# Patient Record
Sex: Male | Born: 1970 | Race: White | Hispanic: No | Marital: Married | Smoking: Current every day smoker
Health system: Southern US, Community
[De-identification: ages and names within clinical notes are randomized; demographics above are authoritative.]

## PROBLEM LIST (undated history)

## (undated) ENCOUNTER — Telehealth

## (undated) ENCOUNTER — Encounter

## (undated) ENCOUNTER — Ambulatory Visit

## (undated) ENCOUNTER — Telehealth: Attending: Infectious Disease | Primary: Infectious Disease

## (undated) ENCOUNTER — Ambulatory Visit: Payer: MEDICARE

## (undated) ENCOUNTER — Inpatient Hospital Stay: Payer: MEDICARE

## (undated) ENCOUNTER — Ambulatory Visit
Payer: MEDICARE | Attending: Rehabilitative and Restorative Service Providers" | Primary: Rehabilitative and Restorative Service Providers"

## (undated) ENCOUNTER — Encounter: Attending: Internal Medicine | Primary: Internal Medicine

## (undated) ENCOUNTER — Ambulatory Visit: Payer: MEDICARE | Attending: Geriatric Medicine | Primary: Geriatric Medicine

## (undated) ENCOUNTER — Encounter: Attending: Dermatology | Primary: Dermatology

## (undated) DIAGNOSIS — I10 Essential (primary) hypertension: Secondary | ICD-10-CM

## (undated) DIAGNOSIS — E119 Type 2 diabetes mellitus without complications: Secondary | ICD-10-CM

## (undated) HISTORY — PX: SHOULDER SURGERY: SHX246

## (undated) HISTORY — PX: NECK SURGERY: SHX720

## (undated) HISTORY — PX: KNEE SURGERY: SHX244

## (undated) MED ORDER — ACETAMINOPHEN 500 MG TABLET: Freq: Two times a day (BID) | ORAL | 0 days

---
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
## 2009-07-30 ENCOUNTER — Emergency Department: Payer: Self-pay | Admitting: Emergency Medicine

## 2010-05-13 ENCOUNTER — Ambulatory Visit: Payer: Self-pay | Admitting: Family Medicine

## 2016-10-18 ENCOUNTER — Ambulatory Visit
Admission: RE | Admit: 2016-10-18 | Discharge: 2016-10-18 | Payer: MEDICARE | Attending: Internal Medicine | Admitting: Internal Medicine

## 2016-10-18 DIAGNOSIS — I10 Essential (primary) hypertension: Secondary | ICD-10-CM

## 2016-10-18 DIAGNOSIS — G4700 Insomnia, unspecified: Secondary | ICD-10-CM

## 2016-10-18 DIAGNOSIS — L409 Psoriasis, unspecified: Secondary | ICD-10-CM

## 2016-10-18 DIAGNOSIS — L4050 Arthropathic psoriasis, unspecified: Secondary | ICD-10-CM

## 2016-10-18 DIAGNOSIS — E119 Type 2 diabetes mellitus without complications: Principal | ICD-10-CM

## 2016-10-18 DIAGNOSIS — Z720 Tobacco use: Secondary | ICD-10-CM

## 2016-10-18 DIAGNOSIS — E7800 Pure hypercholesterolemia, unspecified: Secondary | ICD-10-CM

## 2016-10-18 MED ORDER — METFORMIN 500 MG TABLET
ORAL_TABLET | Freq: Every day | ORAL | 0 refills | 0.00000 days
Start: 2016-10-18 — End: 2016-11-20

## 2016-10-18 MED ORDER — LISINOPRIL 20 MG TABLET
ORAL_TABLET | Freq: Every day | ORAL | 1 refills | 0 days | Status: CP
Start: 2016-10-18 — End: 2016-11-20

## 2016-10-18 MED ORDER — ZOLPIDEM ER 12.5 MG TABLET,EXTENDED RELEASE,MULTIPHASE
ORAL_TABLET | Freq: Every evening | ORAL | 2 refills | 0 days | Status: CP | PRN
Start: 2016-10-18 — End: 2017-10-18

## 2016-10-18 MED ORDER — METHOCARBAMOL 750 MG TABLET
ORAL_TABLET | Freq: Two times a day (BID) | ORAL | 0 refills | 0.00000 days | Status: CP
Start: 2016-10-18 — End: 2017-09-30

## 2016-10-18 NOTE — Unmapped (Signed)
INTERNAL MEDICINE OUTPATIENT NOTE -- REESTABLISH CARE        ASSESSMENT  1. Type 2 diabetes mellitus without complication, without long-term current use of insulin (CMS-HCC)  CBC w/ Differential    Albumin/creatinine urine ratio    Hemoglobin A1c   2. Hypertension, benign  Comprehensive Metabolic Panel   3. Hypercholesterolemia  HDL Cholesterol    LDL Cholesterol, Direct    Cholesterol, Total   4. Psoriatic arthritis (CMS-HCC)  Ambulatory referral to Rheumatology   5. Psoriasis  Ambulatory referral to Dermatology   6. Tobacco use     7. Insomnia, unspecified type           PLAN  1. Diabetes is at goal, per his reported A1c measurements. Continue current medications. Update UACR, foot exam, and CBC today. We discussed the need to schedule a diabetic retinal eye exam in the near future.  2. Hypertension is not at goal. Will add lisinopril, which will also offer renal protection with his diabetes. Also updating CMP today.   3. Update lipid levels today. Will likely restart atorvastatin at next appointment.   4 / 5. Referring to rheumatology and dermatology to reestablish care. Humira no longer on insurance formulary.  5. Referring to dermatology to reestablish care.   6. We decided to defer tobacco cessation efforts until his current stressors are under better control. Congratulated him on his recent reduction in tobacco use.   7. Since wife's death, sleep has been difficult. He is currently managing with a combination of over-the-counter sleep aids, diphenhydramine, and alcohol. Considering the risks with continuing this regimen, we decided that is just able to reinitiate therapy with zolpidem 12.5 mg nightly as needed. Discussed this decision in detail with the patient, who is in agreement with the plan.    Preventive services addressed today  Influenza: patient declined    -- Patient verbalized an understanding of today's assessment and recommendations, as well as the purpose of ongoing medications.    Return in about 1 month (around 11/18/2016).    Reason for Visit:   Reestablish care    History of Present Illness:    This is a 46 y.o. year old male who presents to reestablish care. Last seen June 2015. He moved to [place], and established care there.     He moved back because his wife committed suicide on July 31. In addition, he lost two other family members in the last 18 months, including his father. He feels he has been handling this traumatic loss well. No SI/HI. He has strong family support nearby, and he is attempting to reestablish with psychiatry at the [place]. He continues on prazosin, although he is unsure of what dosage.    1. Diabetes: Metformin 1,000 mg once daily. He hasn't been checking FSGs recently. He recalls A1c measurements of 6.0-6.2% at his primary care office in [place]. He is due for foot exam, and he has not had an eye exam in the last year. Previously on liraglutide, however the medication is no longer on his insurance formulary.    2. Hypertension: In [place], he was able to discontinue all blood pressure medications and maintain good control. However, he recently measured a blood pressure of 176/116. Today's BP: 142/98. No headache, chest pain, or shortness of breath.     3. Hypercholesterolemia: The 10-year ASCVD risk score Denman George DC Jr., et al., 2013) is: 9.2%. Previously taking Crestor.     4. Psoriasis / Psoriatic arthritis: Ongoing issues with skin manifestations of  psoriasis and joint pains related to psoriatic arthritis. He was previously treated with Humira, however the medication was removed from his insurance formulary. He has been off this for at least a year. He is seeking to reestablish care with rheumatology and dermatology at [facility].    5. Tobacco use: He is currently smoking approximately one pack per day, which is actually down from 2.5-3 packs per day around his wife's suicide.     6. Insomnia: He has noticed increased difficulty sleeping since his wife's suicide. He has been managing with over-the-counter sleep aids and occasionally he will take diphenhydramine as well. He has also found a small amount of alcohol helps him fall asleep. He was previously taking zolpidem.     He has been using a CPAP machine nightly. No daytime somnolence.    Since moving to [place], he has had spinal fusion surgery in his neck and arthroscopic knee surgery.     REVIEW OF SYSTEMS: A comprehensive review of systems was conducted and is negative except for the above.    Outpatient Medications Prior to Visit   Medication Sig Dispense Refill   ??? aspirin (ECOTRIN) 81 MG tablet Take 81 mg by mouth daily.     ??? clobetasol (TEMOVATE) 0.05 % external solution Apply 1 application topically two (2) times a day as needed.     ??? esomeprazole (NEXIUM) 40 MG capsule Take 1 capsule (40 mg total) by mouth every morning before breakfast. 90 capsule 0   ??? fluticasone (FLONASE) 50 mcg/actuation nasal spray 2 sprays by Each Nare route daily. 16 g 5   ??? hydrOXYzine (ATARAX) 50 MG tablet Take 50 mg by mouth Two (2) times a day.  0   ??? ketoconazole (NIZORAL) 2 % shampoo Apply to the scalp and beard area three times a week (leave on for 15 minutes) 120 mL 5   ??? prazosin (MINIPRESS) 1 MG capsule Take 1 mg by mouth daily.     ??? zonisamide (ZONEGRAN) 100 MG capsule Take 2-3 capsules by mouth nightly.     ??? metFORMIN (GLUCOPHAGE) 500 MG tablet Take 1,000 mg by mouth Two (2) times a day.     ??? blood sugar diagnostic (GLUCOSE BLOOD) Strp 1 each by Other route daily.     ??? celecoxib (CELEBREX) 200 MG capsule Take 200 mg by mouth Two (2) times a day.     ??? folic acid (FOLVITE) 1 MG tablet Take 1 mg by mouth daily.     ??? vitamin E 400 UNIT capsule Take 400 Units by mouth daily.     ??? adalimumab (HUMIRA PEN) 40 mg/0.8 mL injection Inject 1 pen (40 mg) subcutaneously every 2 weeks (Patient not taking: Reported on 10/18/2016) 2 kit 9   ??? adalimumab (HUMIRA) 40 mg/0.8 mL injection Start with 80 mg Parke x 1 on week 0, then 40 mg Sharpes every 2 weeks starting on week 1. (Patient not taking: Reported on 10/18/2016) 2 each 2   ??? baclofen (LIORESAL) 10 MG tablet Take 1 tablet (10 mg total) by mouth nightly as needed. (Patient not taking: Reported on 10/18/2016) 90 tablet 3   ??? CIALIS 20 mg tablet TAKE 1 TABLET AS NEEDED (Patient not taking: Reported on 10/18/2016) 6 tablet 5   ??? CIPRODEX otic suspension ADMINISTER 4 DROPS INTO   THE LEFT EAR TWO TIMES A  DAY FOR 10 DAYS (Patient not taking: Reported on 10/18/2016) 7.5 mL ml   ??? fluocinonide (LIDEX) 0.05 % external solution Apply  topically up to twice daily. (Patient not taking: Reported on 10/18/2016) 60 mL 0   ??? HUMIRA PEN 40 mg/0.8 mL injection INJECT 1 PEN (40MG )       SUBCUTANEOUSLY EVERY 2    WEEKS (Patient not taking: Reported on 10/18/2016) 3 kit 3   ??? insulin needles, disposable, (BD ULTRA-FINE NANO PEN NEEDLES) 32 x 5/32  Ndle Use with victoza pen once daily. 250.02 (Patient not taking: Reported on 10/18/2016) 100 each 3   ??? liraglutide (VICTOZA) 0.6 mg/0.1 mL (18 mg/3 mL) injection 1.2 mg once daily. (Patient not taking: Reported on 10/18/2016) 6 mL 1   ??? lisinopril (PRINIVIL,ZESTRIL) 10 MG tablet Take 1 tablet (10 mg total) by mouth daily. 90 tablet 3   ??? rosuvastatin (CRESTOR) 40 MG tablet Take 0.5 tablets (20 mg total) by mouth daily with evening meal. (Patient not taking: Reported on 10/18/2016) 30 tablet 6     No facility-administered medications prior to visit.        Allergies   Allergen Reactions   ??? Primaquine      Other reaction(s): OTHER       Updated History:  As part of today's comprehensive wellness visit, I have reviewed and updated the following portions of the patient's history in the electronic record: allergies, current medications, past medical history, past surgical history, past family history, past social history and active problem list.      PHYSICAL EXAM:  BP 142/98 (BP Site: L Arm, BP Position: Sitting, BP Cuff Size: Medium)  - Pulse 100  - Temp 37.1 ??C (98.7 ??F) (Oral)  - Ht 177.8 cm (5' 10)  - Wt (!) 107 kg (236 lb)  - SpO2 97%  - BMI 33.86 kg/m??   GENERAL:  This is a pleasant male in no distress.  The patient maintains good eye contact, good judgment, fluent speech, normal affect.  EYES:  Pupils are equal, round and reactive to light.  Anicteric sclerae.  ENT:  Tympanic membranes are clear bilaterally.  Oropharynx is moist, no lesions, good dentition.   NECK:  Supple, no lymphadenopathy.  THYROID: No enlargement or nodules.   LUNGS:  Relaxed respiratory effort.  Clear to auscultation bilaterally.   CARDIOVASCULAR:  Regular rate and rhythm, no murmurs.   ABDOMEN:  Normal bowel sounds, soft, nontender, nondistended, no masses or organomegaly.   MSK: No focal muscle tenderness.  SKIN: Large, dry, scaly plaques on elbows bilaterally. Small scattered plaques on lower legs bilaterally.   NEURO: Stable gait and coordination   EXTREMITIES:  No edema.  Peripheral pulses are 2+ in the lower extremities bilaterally.       Note - This record has been created using [employer]. Chart creation errors have been sought, but may not always have been located. Such creation errors do not reflect on the standard of medical care.    The documentation recorded by the scribe accurately reflects the service I personally performed and the decisions made by me.    Dr. Dione Housekeeper, MD  October 18, 2016 10:56 AM     I attest that I, Veto Kemps, personally documented this note while acting as scribe for Dr. Dione Housekeeper, MD.      Veto Kemps, Medical Scribe.  October 18, 2016 10:56 AM

## 2016-10-22 ENCOUNTER — Ambulatory Visit: Admission: RE | Admit: 2016-10-22 | Discharge: 2016-10-22 | Payer: MEDICARE

## 2016-10-22 DIAGNOSIS — L409 Psoriasis, unspecified: Principal | ICD-10-CM

## 2016-10-22 MED ORDER — CLOBETASOL 0.05 % TOPICAL OINTMENT
Freq: Two times a day (BID) | TOPICAL | 3 refills | 0 days | Status: CP
Start: 2016-10-22 — End: 2017-10-22

## 2016-10-22 MED ORDER — ADALIMUMAB 40 MG/0.8 ML SUBCUTANEOUS PEN KIT
SUBCUTANEOUS | 0 refills | 0.00000 days | Status: CP
Start: 2016-10-22 — End: 2016-10-22

## 2016-10-22 MED ORDER — ADALIMUMAB 40 MG/0.8 ML SUBCUTANEOUS PEN KIT: each | 0 refills | 0 days

## 2016-10-22 MED ORDER — CLOBETASOL 0.05 % SCALP SOLUTION
Freq: Two times a day (BID) | TOPICAL | 3 refills | 0.00000 days | Status: CP | PRN
Start: 2016-10-22 — End: 2018-09-17

## 2016-10-24 NOTE — Unmapped (Signed)
Humira $24.00

## 2016-10-25 NOTE — Unmapped (Signed)
Spoke to patient to onboard and counsel patient on Humira. Patient states he cannot afford copay ($24.00/month) and declined medication due to cost. Will send referral to medication assistance team at [facility] pharmacy to see if there is any additional/available assistance for the patient.     Annie Main, PharmD Candidate  Pager: [phone]

## 2016-10-28 NOTE — Unmapped (Signed)
Patient was supposed to get labs at his recent appointment, but they were not done.  Please contact him to schedule a lab only appointment.  Thanks.

## 2016-10-29 NOTE — Unmapped (Signed)
Called pt and left vamil to schedule lab appt- call #1

## 2016-10-30 MED ORDER — HYDROCHLOROTHIAZIDE 25 MG TABLET
ORAL_TABLET | Freq: Every day | ORAL | 3 refills | 0.00000 days | Status: CP
Start: 2016-10-30 — End: 2016-11-20

## 2016-10-30 NOTE — Unmapped (Signed)
I sent a prescription to his pharmacy for HCTZ 25 mg once daily.  He can take this w/ the lisinopril.  He was supposed to get labs done after his recent appointment, but left without having them drawn.  Please see if he can come and do a lab only visit sometime this week or next.  Thanks.

## 2016-10-30 NOTE — Unmapped (Signed)
Patient states that since adding lisinopril at his last appointment ( seen on 10/18/16), his blood pressure readings are averaging 170/117, and that he is still having headaches. Please advise. Thanks.

## 2016-10-30 NOTE — Unmapped (Signed)
Patient notified of medication and will pick up today.  I have scheduled him for labs tomorrow at 9 am.

## 2016-10-31 ENCOUNTER — Ambulatory Visit
Admission: RE | Admit: 2016-10-31 | Discharge: 2016-10-31 | Disposition: A | Discharge status: Home / Self Care | Payer: MEDICARE

## 2016-10-31 DIAGNOSIS — I10 Essential (primary) hypertension: Principal | ICD-10-CM

## 2016-10-31 DIAGNOSIS — E119 Type 2 diabetes mellitus without complications: Secondary | ICD-10-CM

## 2016-10-31 DIAGNOSIS — E7800 Pure hypercholesterolemia, unspecified: Secondary | ICD-10-CM

## 2016-10-31 LAB — CBC W/ AUTO DIFF
EOSINOPHILS ABSOLUTE COUNT: 0.1 10*9/L (ref 0.0–0.4)
HEMATOCRIT: 50.7 % (ref 41.0–53.0)
LARGE UNSTAINED CELLS: 2 % (ref 0–4)
LYMPHOCYTES ABSOLUTE COUNT: 2.5 10*9/L (ref 1.5–5.0)
MEAN CORPUSCULAR HEMOGLOBIN CONC: 33.7 g/dL (ref 31.0–37.0)
MEAN CORPUSCULAR VOLUME: 90.1 fL (ref 80.0–100.0)
MEAN PLATELET VOLUME: 8.2 fL (ref 7.0–10.0)
MONOCYTES ABSOLUTE COUNT: 0.4 10*9/L (ref 0.2–0.8)
NEUTROPHILS ABSOLUTE COUNT: 3.7 10*9/L (ref 2.0–7.5)
PLATELET COUNT: 283 10*9/L (ref 150–440)
RED BLOOD CELL COUNT: 5.63 10*12/L (ref 4.50–5.90)
RED CELL DISTRIBUTION WIDTH: 14 % (ref 12.0–15.0)
WBC ADJUSTED: 6.9 10*9/L (ref 4.5–11.0)

## 2016-10-31 LAB — COMPREHENSIVE METABOLIC PANEL
ALBUMIN: 4.3 g/dL (ref 3.5–5.0)
ALKALINE PHOSPHATASE: 84 U/L (ref 38–126)
ALT (SGPT): 68 U/L (ref 19–72)
ANION GAP: 11 mmol/L (ref 9–15)
AST (SGOT): 33 U/L (ref 19–55)
BILIRUBIN TOTAL: 0.6 mg/dL (ref 0.0–1.2)
BLOOD UREA NITROGEN: 13 mg/dL (ref 7–21)
BUN / CREAT RATIO: 18
CALCIUM: 9.8 mg/dL (ref 8.5–10.2)
CHLORIDE: 99 mmol/L (ref 98–107)
CO2: 28 mmol/L (ref 22.0–30.0)
CREATININE: 0.74 mg/dL (ref 0.70–1.30)
EGFR MDRD AF AMER: >=60 mL/min/{1.73_m2} (ref >=60)
EGFR MDRD NON AF AMER: >=60 mL/min/{1.73_m2} (ref >=60)
GLUCOSE RANDOM: 214 mg/dL — ABNORMAL HIGH (ref 65–179)
POTASSIUM: 3.8 mmol/L (ref 3.5–5.0)
SODIUM: 138 mmol/L (ref 135–145)

## 2016-10-31 LAB — ALBUMIN / CREATININE URINE RATIO
ALBUMIN/CREATININE RATIO: 49 ug/mg — ABNORMAL HIGH (ref 0.0–30.0)
CREATININE, URINE: 89.8 mg/dL

## 2016-10-31 LAB — ALBUMIN QUANT URINE: Albumin:MCnc:Pt:Urine:Qn:: 4.4

## 2016-10-31 LAB — CO2: Carbon dioxide:SCnc:Pt:Ser/Plas:Qn:: 28

## 2016-10-31 LAB — ESTIMATED AVERAGE GLUCOSE: Estimated average glucose:MCnc:Pt:Bld:Qn:Estimated from glycated hemoglobin: 194

## 2016-10-31 LAB — HDL CHOLESTEROL: Cholesterol.in HDL:MCnc:Pt:Ser/Plas:Qn:: 41

## 2016-10-31 LAB — LDL CHOLESTEROL DIRECT: Cholesterol.in LDL:MCnc:Pt:Ser/Plas:Qn:Direct assay: 159.8 — ABNORMAL HIGH

## 2016-10-31 LAB — CHOLESTEROL: Cholesterol:MCnc:Pt:Ser/Plas:Qn:: 235 — ABNORMAL HIGH

## 2016-10-31 LAB — HEMOGLOBIN A1C: ESTIMATED AVERAGE GLUCOSE: 194 mg/dL

## 2016-10-31 LAB — LYMPHOCYTES ABSOLUTE COUNT: Lab: 2.5

## 2016-11-01 NOTE — Unmapped (Signed)
Humira $24.00; Co-Pay Card program, Patient ineligible due to Tricare coverage, patient will have to use payment plan

## 2016-11-02 NOTE — Unmapped (Signed)
-----   Message from Loistine Chance, MD sent at 11/01/2016  2:41 PM EDT -----  Please contact patient to see if his blood pressure is better since starting HCTZ.  Please also let him know the following about his blood work:  -- Your cholesterol is higher than desired.  I would like to get you started back on cholesterol lowering medication. -->  If he is ok with it, I would like to send a prescription to his pharmacy for atorvastatin 40 mg qd  -- A1C (measure of blood sugar over the past 3 months) is high, indicating that your diabetes is not well controlled. --> Would he be willing to start back on victoza (did well on this previously).    Thanks.

## 2016-11-03 NOTE — Unmapped (Signed)
Spoke with patient who states that his blood pressure is better. Recent measurement at home = 134/102. Patient is willing to restart statin. He states that he was on Crestor, but is willing to take whichever Dr. Dudley Major prescribes. He reports that the [place] took him off the statin because his cholesterol had become really low. Patient would like to come to next appointment (11/6) and try to work on diet, exercise and medication compliance before restarting Victoza. Please advise. THanks

## 2016-11-05 MED ORDER — ATORVASTATIN 40 MG TABLET
ORAL_TABLET | Freq: Every day | ORAL | 3 refills | 0 days | Status: CP
Start: 2016-11-05 — End: 2016-11-20

## 2016-11-05 NOTE — Unmapped (Signed)
Prescription sent to pharmacy for atorvastatin 40 mg once daily.

## 2016-11-12 MED ORDER — ADALIMUMAB PEN CITRATE FREE 40 MG/0.4 ML: each | 0 refills | 0 days | Status: AC

## 2016-11-12 MED ORDER — ADALIMUMAB SYRINGE CITRATE FREE 40 MG/0.4 ML
0 refills | 0.00000 days
Start: 2016-11-12 — End: 2017-11-12

## 2016-11-12 MED ORDER — ADALIMUMAB PEN CITRATE FREE 40 MG/0.4 ML
SUBCUTANEOUS | 0 refills | 0.00000 days | Status: CP
Start: 2016-11-12 — End: 2016-11-12

## 2016-11-13 NOTE — Unmapped (Signed)
Sent in citrate free Humira to [facility] because this is more affordable than regular Humira.

## 2016-11-14 MED ORDER — FLUTICASONE PROPIONATE 50 MCG/ACTUATION NASAL SPRAY,SUSPENSION
Freq: Every day | NASAL | 3 refills | 0.00000 days | Status: CP
Start: 2016-11-14 — End: 2018-03-18

## 2016-11-14 MED ORDER — BACLOFEN 10 MG TABLET
ORAL_TABLET | Freq: Every evening | ORAL | 1 refills | 0 days | Status: CP | PRN
Start: 2016-11-14 — End: 2017-11-30

## 2016-11-19 MED ORDER — HYDROXYZINE HCL 50 MG TABLET
ORAL_TABLET | Freq: Two times a day (BID) | ORAL | 1 refills | 0.00000 days | Status: CP
Start: 2016-11-19 — End: 2017-06-24

## 2016-11-20 ENCOUNTER — Ambulatory Visit: Admission: RE | Admit: 2016-11-20 | Discharge: 2016-11-20 | Payer: MEDICARE

## 2016-11-20 DIAGNOSIS — F331 Major depressive disorder, recurrent, moderate: Secondary | ICD-10-CM

## 2016-11-20 DIAGNOSIS — Z9989 Dependence on other enabling machines and devices: Secondary | ICD-10-CM

## 2016-11-20 DIAGNOSIS — L4050 Arthropathic psoriasis, unspecified: Secondary | ICD-10-CM

## 2016-11-20 DIAGNOSIS — E1165 Type 2 diabetes mellitus with hyperglycemia: Principal | ICD-10-CM

## 2016-11-20 DIAGNOSIS — I10 Essential (primary) hypertension: Secondary | ICD-10-CM

## 2016-11-20 DIAGNOSIS — Z5181 Encounter for therapeutic drug level monitoring: Secondary | ICD-10-CM

## 2016-11-20 DIAGNOSIS — E7800 Pure hypercholesterolemia, unspecified: Secondary | ICD-10-CM

## 2016-11-20 DIAGNOSIS — G4733 Obstructive sleep apnea (adult) (pediatric): Secondary | ICD-10-CM

## 2016-11-20 DIAGNOSIS — G4700 Insomnia, unspecified: Secondary | ICD-10-CM

## 2016-11-20 DIAGNOSIS — M25562 Pain in left knee: Secondary | ICD-10-CM

## 2016-11-20 MED ORDER — ESOMEPRAZOLE MAGNESIUM 40 MG CAPSULE,DELAYED RELEASE
ORAL_CAPSULE | Freq: Every day | ORAL | 0 refills | 0 days | Status: CP
Start: 2016-11-20 — End: 2018-05-19

## 2016-11-20 MED ORDER — ESCITALOPRAM 10 MG TABLET
ORAL_TABLET | Freq: Every day | ORAL | 0 refills | 0 days | Status: CP
Start: 2016-11-20 — End: 2017-05-02

## 2016-11-20 MED ORDER — METFORMIN 500 MG TABLET
ORAL_TABLET | Freq: Two times a day (BID) | ORAL | 3 refills | 0.00000 days | Status: CP
Start: 2016-11-20 — End: 2016-11-20

## 2016-11-20 MED ORDER — LISINOPRIL 20 MG TABLET
ORAL_TABLET | Freq: Every day | ORAL | 3 refills | 0.00000 days | Status: CP
Start: 2016-11-20 — End: 2017-12-08

## 2016-11-20 MED ORDER — HYDROCHLOROTHIAZIDE 25 MG TABLET
ORAL_TABLET | Freq: Every day | ORAL | 3 refills | 0.00000 days | Status: CP
Start: 2016-11-20 — End: 2018-05-05

## 2016-11-20 MED ORDER — ATORVASTATIN 40 MG TABLET
ORAL_TABLET | Freq: Every day | ORAL | 3 refills | 0.00000 days | Status: CP
Start: 2016-11-20 — End: 2017-11-30

## 2016-11-20 MED ORDER — METFORMIN 500 MG TABLET: 1000 mg | tablet | Freq: Two times a day (BID) | 3 refills | 0 days | Status: AC

## 2016-11-20 NOTE — Unmapped (Signed)
INTERNAL MEDICINE OUTPATIENT NOTE        ASSESSMENT  1. Type 2 diabetes mellitus with hyperglycemia, without long-term current use of insulin (CMS-HCC)     2. Hypertension, benign     3. Hypercholesterolemia     4. Moderate episode of recurrent major depressive disorder (CMS-HCC)     5. Insomnia, unspecified type     6. Psoriatic arthritis (CMS-HCC)     7. OSA on CPAP     8. Acute pain of left knee     9. Medication monitoring encounter  ECG 12 Lead         PLAN  1.  Increase metformin to 1000 mg twice daily.  He has already started making significant lifestyle changes by decreasing soda intake.  He agrees to schedule his eye exam.  2.  Hypertension close to goal.  No changes today.  Encouraged him to work on increased exercise and weight loss.  3.  Continue atorvastatin.  Plan to update lipid panel with his next visit.  4.  Depression not at goal.  Start Escitalopram 10 mg once daily.  [facility]   5.  Continue melatonin and occasional use of Ambien.  6.  He will be able to start on Humira next week.  He is scheduled to see rheumatology in February.  7.  Continue regular CPAP use.  8.  Most likely has ligamentous strain.  Recommended ice, rest and elevation.  9.  He has previous history of QTC prolongation, but QT interval today was normal.      Preventive services addressed today  Preventive services are currently up to date    -- Lifestyle changes:  work on improving diet (food selection, portion control) and increase daily exercise  -- Patient verbalized an understanding of today's assessment and recommendations, as well as the purpose of ongoing medications.    Return in about 3 months (around 02/04/2017).      Reason for Visit:   Follow-up on diabetes, hypertension and depression.    History of Present Illness:    This is a 46 y.o. year old male with past medical history of type 2 diabetes, hypertension, hyperlipidemia, depression and psoriasis who presents for follow-up.  He was here about a month ago to reestablish care.  Moved back to this area from [place] after his wife committed suicide on July 31.  ??  1. Diabetes: Hemoglobin A1c 8.4% last month.  He is currently taking Metformin 1,000 mg once daily.  He states that after his wife died, he was literally drinking Dr. Reino Kent all day long.  Since receiving the results of his A1c, he has cut that down significantly to 1-2 sodas per day.  He checked one blood sugar reading which was 140.    ??  2. Hypertension:  Now on lisinopril 20 mg once daily and HCTZ 25 mg once daily.  Home blood pressure readings have improved.  Blood pressure close to goal here today.  He does note that he is urinating frequently, which he attributes to the HCTZ.  No headaches, chest pain, shortness of breath or swelling.  ??  3. Hypercholesterolemia: DL 161 last month.  He was started on atorvastatin 40 mg once daily.  No muscle aches or weakness.    4. Depression:  Worsening control since his wife died.  Today he scores 10 on depression screen.  No SI/HI.  He states that while he was in [place], he was treated  with Zoloft, but this had to be stopped secondary to QT prolongation, up to 550.    5. Insomnia: He takes melatonin 10 mg at bedtime.  He is using Ambien CR 12.5 mg about twice a week.  ??  6. Psoriasis / Psoriatic arthritis: Ongoing issues with skin manifestations of psoriasis and joint pains related to psoriatic arthritis. He was previously treated with Humira, was off of this for about a year due to insurance issues.  He is now back established with [facility] dermatology.  Room air has been approved and he is supposed to get a shipment next week.    7. OSA:  He has been using a CPAP machine nightly. No daytime somnolence.    8. Tobacco use: He is currently smoking approximately one pack per day, which is actually down from 2.5-3 packs per day around his wife's suicide.     He reports discomfort just posterior to his left knee which started about 4 days ago.  No specific injury.  Certain positions such as squatting and then started to stand up will elicit pain.  He has not noticed any redness or swelling in the area.  ??    REVIEW OF SYSTEMS: A comprehensive review of systems was conducted and is negative except for the above.       Past Medical History:  Active Ambulatory Problems     Diagnosis Date Noted   ??? Moderate episode of recurrent major depressive disorder (CMS-HCC) 05/03/2010   ??? Type II diabetes mellitus (CMS-HCC) 05/03/2010   ??? Nonspecific elevation of level of transaminase or lactic acid dehydrogenase (LDH) 08/03/2010   ??? Hypercholesterolemia 05/03/2010   ??? Hypertension, benign 05/03/2010   ??? Testicular hypofunction 05/03/2010   ??? Insomnia 05/03/2010   ??? Migraine 05/03/2010   ??? 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 Plantar fascial fibromatosis 06/04/2011   ??? OSA on CPAP 06/17/2012   ??? GERD (gastroesophageal reflux disease) 06/17/2012   ??? Obesity 06/17/2012   ??? Tobacco use 06/17/2012   ??? Psoriasis 12/01/2012   ??? Psoriatic arthritis (CMS-HCC) 12/01/2012   ??? Diabetes (CMS-HCC) 03/16/2013     Resolved Ambulatory Problems     Diagnosis Date Noted   ??? Trauma of ear canal 12/29/2012   ??? Otitis externa 03/16/2013     Past Medical History:   Diagnosis Date   ??? Psoriasis        Medications:  Current Outpatient Prescriptions   Medication Sig Dispense Refill   ??? adalimumab (HUMIRA) 40 mg/0.8 mL subcutaneous pen kit 80 mg SQ first dose then 40 mg q 2weeks 7 each 0   ??? ADALIMUMAB PEN CITRATE FREE 40 MG/0.4 ML 80 mg x 1, then 40 mg every other week beginning 1 week after initial dose 14 each 0   ??? aspirin (ECOTRIN) 81 MG tablet Take 81 mg by mouth daily.     ??? atorvastatin (LIPITOR) 40 MG tablet Take 1 tablet (40 mg total) by mouth daily. 90 tablet 3   ??? baclofen (LIORESAL) 10 MG tablet Take 1 tablet (10 mg total) by mouth nightly as needed for muscle spasms. 90 tablet 1   ??? blood sugar diagnostic (GLUCOSE BLOOD) Strp 1 each by Other route daily.     ??? celecoxib (CELEBREX) 200 MG capsule Take 200 mg by mouth Two (2) times a day.     ??? clobetasol (TEMOVATE) 0.05 % external solution Apply 1 application topically two (2) times a day as needed. 50 mL 3   ???  clobetasol (TEMOVATE) 0.05 % ointment Apply topically Two (2) times a day. 60 g 3   ??? escitalopram oxalate (LEXAPRO) 10 MG tablet Take 1 tablet (10 mg total) by mouth daily. 30 tablet 0   ??? esomeprazole (NEXIUM) 40 MG capsule Take 1 capsule (40 mg total) by mouth every morning before breakfast. 90 capsule 0   ??? fluticasone (FLONASE) 50 mcg/actuation nasal spray 2 sprays by Each Nare route daily. 3 Bottle 3   ??? folic acid (FOLVITE) 1 MG tablet Take 1 mg by mouth daily.     ??? hydroCHLOROthiazide (HYDRODIURIL) 25 MG tablet Take 1 tablet (25 mg total) by mouth daily. 90 tablet 3   ??? hydrOXYzine (ATARAX) 50 MG tablet Take 1 tablet (50 mg total) by mouth Two (2) times a day. For anxiety 180 tablet 1   ??? ketoconazole (NIZORAL) 2 % shampoo Apply to the scalp and beard area three times a week (leave on for 15 minutes) (Patient not taking: Reported on 10/22/2016) 120 mL 5   ??? lisinopril (PRINIVIL,ZESTRIL) 20 MG tablet Take 1 tablet (20 mg total) by mouth daily. 90 tablet 1   ??? loratadine (CLARITIN) 10 mg tablet loratadine 10 mg tablet     ??? metFORMIN (GLUCOPHAGE) 500 MG tablet Take 2 tablets (1,000 mg total) by mouth 2 (two) times a day with meals. 360 tablet 3   ??? methocarbamol (ROBAXIN-750) 750 MG tablet Take 1 tablet (750 mg total) by mouth Two (2) times a day. 180 tablet 0   ??? prazosin (MINIPRESS) 1 MG capsule Take 1 mg by mouth daily.     ??? vitamin E 400 UNIT capsule Take 400 Units by mouth daily.     ??? zolpidem (AMBIEN CR) 12.5 MG CR tablet Take 1 tablet (12.5 mg total) by mouth nightly as needed for sleep. 30 tablet 2   ??? zonisamide (ZONEGRAN) 100 MG capsule Take 2-3 capsules by mouth nightly.       No current facility-administered medications for this visit.        Allergies:  Allergies   Allergen Reactions   ??? Primaquine      Other reaction(s): OTHER       Social History:  Social History   Substance Use Topics   ??? Smoking status: Current Every Day Smoker   ??? Smokeless tobacco: Never Used   ??? Alcohol use Yes      Comment: occasional - 3 per six months               PHYSICAL EXAM:  BP 132/82 (BP Site: L Arm, BP Position: Sitting)  - Pulse 101  - Ht 177.8 cm (5' 10)  - Wt (!) 106.4 kg (234 lb 9.6 oz)  - SpO2 97%  - BMI 33.66 kg/m??   GENERAL:  This is a pleasant male in no distress.  The patient maintains good eye contact, good judgment, fluent speech, normal affect.  EYES: Anicteric sclerae.   NECK:  Supple, no lymphadenopathy.  LUNGS:  Relaxed respiratory effort.  Clear to auscultation bilaterally.   CARDIOVASCULAR:  Regular rate and rhythm, no murmurs.   ABDOMEN:  Normal bowel sounds, soft, nontender, nondistended, no masses or organomegaly.   MSK: No obvious baker's cyst on the left.    SKIN: Appropriately warm and moist.  No concerning rashes or lesions.   NEURO: Stable gait and coordination   EXTREMITIES:  No edema.  Peripheral pulses are 2+ in the lower extremities bilaterally.       Note -  This record has been created using [employer]. Chart creation errors have been sought, but may not always have been located. Such creation errors do not reflect on the standard of medical care.

## 2016-11-20 NOTE — Unmapped (Signed)
Done

## 2016-11-20 NOTE — Unmapped (Addendum)
Thanks for choosing [facility] at [place]  for your medical care!    If you have any questions about your visit today, please call us at [phone].      For medication refills, please have your pharmacist send an electronic refill request    If you need care after 5:00 pm during the week or on the weekend:  Call [facility] at [phone] for nurse/physician advice or...    Go to [facility] walk-in clinic [address], [address], [place] [phone] -- Open 7 days a week from 9:00AM - 8:00PM        We will always try to notify you of the results from laboratory tests within ten days of the study.  If you do not hear from us by phone, letter, or electronic message, please call the office immediately for further information.

## 2016-11-20 NOTE — Unmapped (Signed)
Received new prescription request for this re-established patient. Patient states that his current prescription bottle for Hydroxyzine reads: Hydroxyzine HCL 50 mg Take 1 tab BID prn for anxiety. The prescription request was to [facility]. Please advise. Thanks

## 2016-12-18 MED ORDER — PRAZOSIN 2 MG CAPSULE
ORAL_CAPSULE | Freq: Every evening | ORAL | 3 refills | 0 days | Status: CP
Start: 2016-12-18 — End: 2018-06-30

## 2017-01-04 ENCOUNTER — Other Ambulatory Visit: Payer: Self-pay

## 2017-01-04 ENCOUNTER — Ambulatory Visit
Admission: EM | Admit: 2017-01-04 | Discharge: 2017-01-04 | Disposition: A | Discharge status: Home / Self Care | Payer: Medicare Other

## 2017-01-04 ENCOUNTER — Encounter: Payer: Self-pay | Admitting: Emergency Medicine

## 2017-01-04 DIAGNOSIS — D849 Immunodeficiency, unspecified: Secondary | ICD-10-CM

## 2017-01-04 DIAGNOSIS — J069 Acute upper respiratory infection, unspecified: Secondary | ICD-10-CM | POA: Diagnosis not present

## 2017-01-04 DIAGNOSIS — H6693 Otitis media, unspecified, bilateral: Secondary | ICD-10-CM

## 2017-01-04 DIAGNOSIS — L4052 Psoriatic arthritis mutilans: Secondary | ICD-10-CM

## 2017-01-04 DIAGNOSIS — D899 Disorder involving the immune mechanism, unspecified: Secondary | ICD-10-CM

## 2017-01-04 DIAGNOSIS — L4050 Arthropathic psoriasis, unspecified: Secondary | ICD-10-CM

## 2017-01-04 HISTORY — DX: Type 2 diabetes mellitus without complications: E11.9

## 2017-01-04 HISTORY — DX: Essential (primary) hypertension: I10

## 2017-01-04 MED ORDER — AMOXICILLIN-POT CLAVULANATE 875-125 MG PO TABS: 1.0000 | ORAL_TABLET | Freq: Two times a day (BID) | ORAL | 0 refills | Status: DC

## 2017-01-04 NOTE — ED Provider Notes (Signed)
[facility]    CSN: [id number] Arrival date & time: 01/04/17  1311     History   Chief Complaint Chief Complaint  Patient presents with  . Otalgia    HPI George Downs is a 46 y.o. male.   Patient is a 46 year old male with history as noted below who presents with complaint of left ear itching that began a couple days ago but this morning feels clogged and he said it sounds like he is talking into a ground. He reports his left ear is not bothering him. He reports that earlier this week on Sunday and Monday he had some sneezing fits and runny nose that is better. He is on Flonase and Singulair daily he says. He denies any shortness of breath, fever, chest pain, nausea and vomiting.  Of note, the patient is on Humira with injections done every 2 weeks. His last injection was last Friday on December 14.      Past Medical History:  Diagnosis Date  . Diabetes mellitus without complication (HCC)   . Hypertension     There are no active problems to display for this patient.   Past Surgical History:  Procedure Laterality Date  . KNEE SURGERY Left   . NECK SURGERY    . SHOULDER SURGERY Right      Home Medications    Prior to Admission medications   Medication Sig Start Date End Date Taking? Authorizing Provider  hydrochlorothiazide (HYDRODIURIL) 25 MG tablet Take 25 mg by mouth daily.   Yes [provider]  lisinopril (PRINIVIL,ZESTRIL) 10 MG tablet Take 10 mg by mouth daily.   Yes [provider]  montelukast (SINGULAIR) 10 MG tablet Take 10 mg by mouth at bedtime.   Yes [provider]  amoxicillin-clavulanate (AUGMENTIN) 875-125 MG tablet Take 1 tablet by mouth every 12 (twelve) hours. 01/04/17   Candis SchatzHarris, Glorimar Stroope D, PA-C    Family History Family History  Problem Relation Age of Onset  . Hypertension Mother   . Diabetes Mother   . Hypertension Father     Social History Social History   Tobacco Use  . Smoking status:  Current Every Day Smoker    Packs/day: 1.00    Types: Cigarettes  . Smokeless tobacco: Former Engineer, waterUser  Substance Use Topics  . Alcohol use: Yes  . Drug use: No     Allergies   Aspirin   Review of Systems Review of Systems  As above in history of present illness. Other systems reviewed and found to be negative.   Physical Exam Triage Vital Signs ED Triage Vitals  Enc Vitals Group     BP 01/04/17 1336 111/81     Pulse Rate 01/04/17 1336 (!) 102     Resp 01/04/17 1336 16     Temp 01/04/17 1336 98.2 F (36.8 C)     Temp Source 01/04/17 1336 Oral     SpO2 01/04/17 1336 97 %     Weight 01/04/17 1329 230 lb (104.3 kg)     Height 01/04/17 1329 5\' 10"  (1.778 m)     Head Circumference --      Peak Flow --      Pain Score 01/04/17 1330 2     Pain Loc --      Pain Edu? --      Excl. in GC? --    No data found.  Updated Vital Signs BP 111/81 (BP Location: Left Arm)   Pulse (!) 102   Temp  98.2 F (36.8 C) (Oral)   Resp 16   Ht 5\' 10"  (1.778 m)   Wt 230 lb (104.3 kg)   SpO2 97%   BMI 33.00 kg/m   Visual Acuity Right Eye Distance:   Left Eye Distance:   Bilateral Distance:    Right Eye Near:   Left Eye Near:    Bilateral Near:     Physical Exam  Constitutional: He is oriented to person, place, and time. He appears well-developed and well-nourished. No distress.  HENT:  Head: Normocephalic and atraumatic.  Right Ear: Ear canal normal. A middle ear effusion is present.  Left Ear: A middle ear effusion is present.  Nose: Right sinus exhibits no maxillary sinus tenderness and no frontal sinus tenderness. Left sinus exhibits no maxillary sinus tenderness and no frontal sinus tenderness.  Mouth/Throat: Uvula is midline. Tonsils are 0 on the right. Tonsils are 0 on the left.  Mild throat erythema with clear postnasal drainage  Cardiovascular: Normal rate, regular rhythm and normal heart sounds.  Pulmonary/Chest: Effort normal and breath sounds normal. No stridor. No  respiratory distress. He has no wheezes.  Abdominal: Bowel sounds are normal.  Musculoskeletal: Normal range of motion. He exhibits no edema.  Neurological: He is alert and oriented to person, place, and time. No cranial nerve deficit.  Skin: Skin is warm and dry.     UC Treatments / Results  Labs (all labs ordered are listed, but only abnormal results are displayed) Labs Reviewed - No data to display  EKG  EKG Interpretation None       Radiology No results found.  Procedures Procedures (including critical care time)  Medications Ordered in UC Medications - No data to display   Initial Impression / Assessment and Plan / UC Course  I have reviewed the triage vital signs and the nursing notes.  Pertinent labs & imaging results that were available during my care of the patient were reviewed by me and considered in my medical decision making (see chart for details).     Likely in setting of upper respiratory issues. Continue patient's Singulair and Flonase. Recommend sinus rinse as well. Will give him a course of Augmentin due to his immunosuppression. Recommend he hold off his next injection until he completes antibiotics in his ear symptoms are resolved.  Final Clinical Impressions(s) / UC Diagnoses   Final diagnoses:  Bilateral otitis media, unspecified otitis media type  Psoriatic arthritis (HCC)  Immunosuppressed status (HCC)  Upper respiratory tract infection, unspecified type    ED Discharge Orders        Ordered    amoxicillin-clavulanate (AUGMENTIN) 875-125 MG tablet  Every 12 hours     01/04/17 1402       Controlled Substance Prescriptions [place] Controlled Substance Registry consulted? Not Applicable   Candis SchatzHarris, Kabeer Hoagland D, PA-C 01/04/17 1407

## 2017-01-04 NOTE — Discharge Instructions (Signed)
-  Augmentin: 1 tablet twice a day until gone -Continue your Singulair and Flonase. For the Flonase, rectus spray slightly towards the ears. -Sinus rinse with instructions as attached. -Would hold off on your next Humira injection until we have completed antibiotics and ureter symptoms have resolved.. If questions can call your PCP or rheumatologist. -Follow-up with primary care provider this clinic as needed should her symptoms worsen or not improve.

## 2017-01-04 NOTE — ED Triage Notes (Signed)
Patient c/o right ear pain that started last night.  

## 2017-01-11 ENCOUNTER — Ambulatory Visit: Admission: RE | Admit: 2017-01-11 | Discharge: 2017-01-11 | Admitting: Internal Medicine

## 2017-01-11 DIAGNOSIS — H66001 Acute suppurative otitis media without spontaneous rupture of ear drum, right ear: Principal | ICD-10-CM

## 2017-01-11 MED ORDER — AMOXICILLIN 875 MG-POTASSIUM CLAVULANATE 125 MG TABLET
ORAL_TABLET | Freq: Two times a day (BID) | ORAL | 0 refills | 0 days | Status: CP
Start: 2017-01-11 — End: 2017-01-18

## 2017-01-11 MED ORDER — AMOXICILLIN 875 MG TABLET
ORAL_TABLET | Freq: Two times a day (BID) | ORAL | 0 refills | 0.00000 days | Status: CP
Start: 2017-01-11 — End: 2017-01-11

## 2017-01-29 ENCOUNTER — Ambulatory Visit: Admit: 2017-01-29 | Discharge: 2017-01-30 | Payer: MEDICARE | Attending: Internal Medicine | Primary: Internal Medicine

## 2017-01-29 DIAGNOSIS — H66001 Acute suppurative otitis media without spontaneous rupture of ear drum, right ear: Principal | ICD-10-CM

## 2017-02-15 ENCOUNTER — Ambulatory Visit: Admit: 2017-02-15 | Discharge: 2017-02-16 | Payer: MEDICARE | Attending: Internal Medicine | Primary: Internal Medicine

## 2017-02-15 DIAGNOSIS — H6121 Impacted cerumen, right ear: Principal | ICD-10-CM

## 2017-02-27 ENCOUNTER — Ambulatory Visit: Admit: 2017-02-27 | Discharge: 2017-02-27 | Payer: MEDICARE

## 2017-02-27 DIAGNOSIS — M7521 Bicipital tendinitis, right shoulder: Secondary | ICD-10-CM

## 2017-02-27 DIAGNOSIS — L4050 Arthropathic psoriasis, unspecified: Principal | ICD-10-CM

## 2017-02-27 DIAGNOSIS — L409 Psoriasis, unspecified: Secondary | ICD-10-CM

## 2017-02-27 MED ORDER — DEXAMETHASONE SODIUM PHOSPHATE 4 MG/ML INJECTION SOLUTION
Freq: Once | 0 refills | 0 days | Status: CP | PRN
Start: 2017-02-27 — End: 2017-05-09

## 2017-04-01 ENCOUNTER — Ambulatory Visit: Admit: 2017-04-01 | Discharge: 2017-04-02 | Payer: MEDICARE | Attending: Internal Medicine | Primary: Internal Medicine

## 2017-04-01 DIAGNOSIS — M79661 Pain in right lower leg: Secondary | ICD-10-CM

## 2017-04-01 DIAGNOSIS — E1165 Type 2 diabetes mellitus with hyperglycemia: Principal | ICD-10-CM

## 2017-04-01 MED ORDER — DULOXETINE 20 MG CAPSULE,DELAYED RELEASE
ORAL_CAPSULE | Freq: Every day | ORAL | 2 refills | 0.00000 days | Status: CP
Start: 2017-04-01 — End: 2017-05-01

## 2017-04-03 ENCOUNTER — Ambulatory Visit
Admit: 2017-04-03 | Discharge: 2017-05-02 | Payer: MEDICARE | Attending: Rehabilitative and Restorative Service Providers" | Primary: Rehabilitative and Restorative Service Providers"

## 2017-04-03 ENCOUNTER — Ambulatory Visit: Admit: 2017-04-03 | Discharge: 2017-04-04 | Payer: MEDICARE

## 2017-04-03 DIAGNOSIS — M7521 Bicipital tendinitis, right shoulder: Secondary | ICD-10-CM

## 2017-04-03 DIAGNOSIS — L409 Psoriasis, unspecified: Principal | ICD-10-CM

## 2017-04-03 DIAGNOSIS — M170 Bilateral primary osteoarthritis of knee: Principal | ICD-10-CM

## 2017-04-03 DIAGNOSIS — M19079 Primary osteoarthritis, unspecified ankle and foot: Secondary | ICD-10-CM

## 2017-04-03 MED ORDER — PEN NEEDLE, DIABETIC 32 GAUGE X 5/32" (4 MM)
3 refills | 0 days | Status: CP
Start: 2017-04-03 — End: ?

## 2017-04-03 MED ORDER — LIRAGLUTIDE 0.6 MG/0.1 ML (18 MG/3 ML) SUBCUTANEOUS PEN INJECTOR
11 refills | 0 days | Status: CP
Start: 2017-04-03 — End: 2017-05-09

## 2017-04-03 MED ORDER — ADALIMUMAB 40 MG/0.8 ML SUBCUTANEOUS PEN KIT
4 refills | 0 days | Status: CP
Start: 2017-04-03 — End: 2017-04-08

## 2017-04-03 MED ORDER — ADALIMUMAB 40 MG/0.8 ML SUBCUTANEOUS PEN KIT: each | 4 refills | 0 days | Status: AC

## 2017-04-08 MED ORDER — ADALIMUMAB PEN CITRATE FREE 40 MG/0.4 ML
SUBCUTANEOUS | 1 refills | 0.00000 days | Status: CP
Start: 2017-04-08 — End: 2017-07-29

## 2017-04-12 DIAGNOSIS — M19079 Primary osteoarthritis, unspecified ankle and foot: Secondary | ICD-10-CM

## 2017-04-12 DIAGNOSIS — M170 Bilateral primary osteoarthritis of knee: Principal | ICD-10-CM

## 2017-04-15 DIAGNOSIS — M170 Bilateral primary osteoarthritis of knee: Principal | ICD-10-CM

## 2017-04-15 DIAGNOSIS — M19079 Primary osteoarthritis, unspecified ankle and foot: Secondary | ICD-10-CM

## 2017-04-29 DIAGNOSIS — M170 Bilateral primary osteoarthritis of knee: Principal | ICD-10-CM

## 2017-04-29 DIAGNOSIS — M19079 Primary osteoarthritis, unspecified ankle and foot: Secondary | ICD-10-CM

## 2017-05-02 MED ORDER — DULOXETINE 20 MG CAPSULE,DELAYED RELEASE
ORAL_CAPSULE | 3 refills | 0 days | Status: CP
Start: 2017-05-02 — End: 2017-05-09

## 2017-05-09 ENCOUNTER — Ambulatory Visit
Admit: 2017-05-09 | Discharge: 2017-06-01 | Payer: MEDICARE | Attending: Rehabilitative and Restorative Service Providers" | Primary: Rehabilitative and Restorative Service Providers"

## 2017-05-09 ENCOUNTER — Ambulatory Visit: Admit: 2017-05-09 | Discharge: 2017-05-09 | Payer: MEDICARE

## 2017-05-09 DIAGNOSIS — E7800 Pure hypercholesterolemia, unspecified: Secondary | ICD-10-CM

## 2017-05-09 DIAGNOSIS — Z720 Tobacco use: Secondary | ICD-10-CM

## 2017-05-09 DIAGNOSIS — F331 Major depressive disorder, recurrent, moderate: Secondary | ICD-10-CM

## 2017-05-09 DIAGNOSIS — E119 Type 2 diabetes mellitus without complications: Principal | ICD-10-CM

## 2017-05-09 DIAGNOSIS — G8929 Other chronic pain: Secondary | ICD-10-CM

## 2017-05-09 DIAGNOSIS — M25571 Pain in right ankle and joints of right foot: Secondary | ICD-10-CM

## 2017-05-09 DIAGNOSIS — M170 Bilateral primary osteoarthritis of knee: Principal | ICD-10-CM

## 2017-05-09 DIAGNOSIS — L409 Psoriasis, unspecified: Secondary | ICD-10-CM

## 2017-05-09 DIAGNOSIS — G4733 Obstructive sleep apnea (adult) (pediatric): Secondary | ICD-10-CM

## 2017-05-09 DIAGNOSIS — G4700 Insomnia, unspecified: Secondary | ICD-10-CM

## 2017-05-09 DIAGNOSIS — M19079 Primary osteoarthritis, unspecified ankle and foot: Secondary | ICD-10-CM

## 2017-05-09 DIAGNOSIS — I10 Essential (primary) hypertension: Secondary | ICD-10-CM

## 2017-05-09 DIAGNOSIS — Z0000 Encounter for general adult medical examination without abnormal findings: Secondary | ICD-10-CM

## 2017-05-09 DIAGNOSIS — Z9989 Dependence on other enabling machines and devices: Secondary | ICD-10-CM

## 2017-05-14 ENCOUNTER — Ambulatory Visit: Admit: 2017-05-14 | Discharge: 2017-05-15 | Payer: MEDICARE

## 2017-05-14 ENCOUNTER — Emergency Department
Admission: EM | Admit: 2017-05-14 | Discharge: 2017-05-14 | Disposition: A | Discharge status: Home / Self Care | Payer: Medicare Other | Attending: Emergency Medicine | Admitting: Emergency Medicine

## 2017-05-14 ENCOUNTER — Other Ambulatory Visit: Payer: Self-pay

## 2017-05-14 ENCOUNTER — Encounter: Payer: Self-pay | Admitting: Emergency Medicine

## 2017-05-14 ENCOUNTER — Emergency Department: Payer: Medicare Other

## 2017-05-14 DIAGNOSIS — L409 Psoriasis, unspecified: Principal | ICD-10-CM

## 2017-05-14 DIAGNOSIS — R0789 Other chest pain: Secondary | ICD-10-CM | POA: Diagnosis present

## 2017-05-14 DIAGNOSIS — E119 Type 2 diabetes mellitus without complications: Secondary | ICD-10-CM | POA: Diagnosis not present

## 2017-05-14 DIAGNOSIS — I10 Essential (primary) hypertension: Secondary | ICD-10-CM | POA: Diagnosis not present

## 2017-05-14 DIAGNOSIS — Z7984 Long term (current) use of oral hypoglycemic drugs: Secondary | ICD-10-CM | POA: Diagnosis not present

## 2017-05-14 DIAGNOSIS — F17210 Nicotine dependence, cigarettes, uncomplicated: Secondary | ICD-10-CM | POA: Insufficient documentation

## 2017-05-14 DIAGNOSIS — Z79899 Other long term (current) drug therapy: Secondary | ICD-10-CM | POA: Diagnosis not present

## 2017-05-14 DIAGNOSIS — K859 Acute pancreatitis without necrosis or infection, unspecified: Secondary | ICD-10-CM | POA: Diagnosis not present

## 2017-05-14 LAB — CBC WITH DIFFERENTIAL/PLATELET
BASOS PCT: 1 %
Basophils Absolute: 0.1 10*3/uL (ref 0–0.1)
EOS ABS: 0.2 10*3/uL (ref 0–0.7)
EOS PCT: 1 %
HCT: 43.7 % (ref 40.0–52.0)
HEMOGLOBIN: 15.3 g/dL (ref 13.0–18.0)
Lymphocytes Relative: 25 %
Lymphs Abs: 3.1 10*3/uL (ref 1.0–3.6)
MCH: 31.7 pg (ref 26.0–34.0)
MCHC: 35.1 g/dL (ref 32.0–36.0)
MCV: 90.3 fL (ref 80.0–100.0)
Monocytes Absolute: 1.1 10*3/uL — ABNORMAL HIGH (ref 0.2–1.0)
Monocytes Relative: 8 %
NEUTROS PCT: 65 %
Neutro Abs: 8.1 10*3/uL — ABNORMAL HIGH (ref 1.4–6.5)
PLATELETS: 270 10*3/uL (ref 150–440)
RBC: 4.84 MIL/uL (ref 4.40–5.90)
RDW: 13.6 % (ref 11.5–14.5)
WBC: 12.6 10*3/uL — ABNORMAL HIGH (ref 3.8–10.6)

## 2017-05-14 LAB — COMPREHENSIVE METABOLIC PANEL
ALT: 24 U/L (ref 17–63)
AST: 27 U/L (ref 15–41)
Albumin: 4 g/dL (ref 3.5–5.0)
Alkaline Phosphatase: 101 U/L (ref 38–126)
Anion gap: 11 (ref 5–15)
BUN: 12 mg/dL (ref 6–20)
CHLORIDE: 98 mmol/L — AB (ref 101–111)
CO2: 25 mmol/L (ref 22–32)
CREATININE: 0.85 mg/dL (ref 0.61–1.24)
Calcium: 8.8 mg/dL — ABNORMAL LOW (ref 8.9–10.3)
GFR calc non Af Amer: >60 mL/min (ref >60)
Glucose, Bld: 209 mg/dL — ABNORMAL HIGH (ref 65–99)
POTASSIUM: 3.3 mmol/L — AB (ref 3.5–5.1)
SODIUM: 134 mmol/L — AB (ref 135–145)
Total Bilirubin: 0.3 mg/dL (ref 0.3–1.2)
Total Protein: 7.4 g/dL (ref 6.5–8.1)

## 2017-05-14 LAB — TRIGLYCERIDES: TRIGLYCERIDES: 169 mg/dL — AB (ref <150)

## 2017-05-14 LAB — MAGNESIUM: MAGNESIUM: 1.8 mg/dL (ref 1.7–2.4)

## 2017-05-14 LAB — LIPID PANEL
CHOL/HDL RATIO: 3.2 ratio
CHOLESTEROL: 94 mg/dL (ref 0–200)
HDL: 29 mg/dL — AB (ref >40)
LDL Cholesterol: 32 mg/dL (ref 0–99)
TRIGLYCERIDES: 167 mg/dL — AB (ref <150)
VLDL: 33 mg/dL (ref 0–40)

## 2017-05-14 LAB — TROPONIN I

## 2017-05-14 LAB — LIPASE, BLOOD: LIPASE: 1579 U/L — AB (ref 11–51)

## 2017-05-14 MED ORDER — ONDANSETRON HCL 4 MG/2ML IJ SOLN
4.0000 mg | INTRAMUSCULAR | Status: AC
Start: 2017-05-14 — End: 2017-05-14
  Administered 2017-05-14: 4 mg via INTRAVENOUS
  Filled 2017-05-14: qty 2

## 2017-05-14 MED ORDER — DOCUSATE SODIUM 100 MG PO CAPS: ORAL_CAPSULE | ORAL | 0 refills | Status: AC

## 2017-05-14 MED ORDER — ONDANSETRON 4 MG PO TBDP: ORAL_TABLET | ORAL | 0 refills | Status: AC

## 2017-05-14 MED ORDER — SODIUM CHLORIDE 0.9 % IV BOLUS
1000.0000 mL | INTRAVENOUS | Status: AC
  Administered 2017-05-14: 1000 mL via INTRAVENOUS

## 2017-05-14 MED ORDER — LACTATED RINGERS IV SOLN
1000.0000 mL | Freq: Once | INTRAVENOUS | Status: AC
  Administered 2017-05-14: 1000 mL via INTRAVENOUS

## 2017-05-14 MED ORDER — HYDROCODONE-ACETAMINOPHEN 5-325 MG PO TABS: 1.0000 | ORAL_TABLET | ORAL | 0 refills | Status: AC | PRN

## 2017-05-14 MED ORDER — IOPAMIDOL (ISOVUE-370) INJECTION 76%
75.0000 mL | Freq: Once | INTRAVENOUS | Status: AC | PRN
  Administered 2017-05-14: 75 mL via INTRAVENOUS

## 2017-05-14 NOTE — ED Provider Notes (Signed)
[facility] Emergency Department Provider Note  ____________________________________________   First MD Initiated Contact with Patient 05/14/17 [phone]     (approximate)  I have reviewed the triage vital signs and the nursing notes.   HISTORY  Chief Complaint Chest Pain    HPI George Downs is a 47 y.o. male with medical history as listed below which also reportedly includes PTSD who presents for evaluation of acute onset chest pain tonight associated with shortness of breath.  He states that he was relaxing and lying down but not yet asleep when the chest pain began.  He described as both sharp and aching and in the middle part of his chest.  He is also having nausea and is actively vomiting in the exam room while I was evaluating him.  He states that about 3 or 4 days ago he had what he thought was a stomach bug with nausea, vomiting, and diarrhea.  He has been feeling better from that over the last day but still having some abdominal discomfort.  However the chest pain is acute in onset tonight.  Overall he describes his symptoms as severe.  Nothing makes it better or worse.  He has no personal cardiac history but he reportedly has multiple family members who have had heart attacks.  He has diabetes and is an every day smoker.  Past Medical History:  Diagnosis Date  . Diabetes mellitus without complication (HCC)   . Hypertension     There are no active problems to display for this patient.   Past Surgical History:  Procedure Laterality Date  . KNEE SURGERY Left   . NECK SURGERY    . SHOULDER SURGERY Right     Prior to Admission medications   Medication Sig Start Date End Date Taking? Authorizing Provider  atorvastatin (LIPITOR) 40 MG tablet Take 40 mg by mouth daily. 05/02/17  Yes [provider]  DULoxetine (CYMBALTA) 20 MG capsule Take 20 mg by mouth daily. 05/01/17  Yes [provider]  hydrOXYzine (ATARAX/VISTARIL) 50 MG tablet  Take 50 mg by mouth 2 (two) times daily as needed.   Yes [provider]  lisinopril (PRINIVIL,ZESTRIL) 20 MG tablet Take 20 mg by mouth daily. 03/12/17  Yes [provider]  metFORMIN (GLUCOPHAGE) 500 MG tablet Take 2 tablets by mouth 2 (two) times daily. 04/08/17  Yes [provider]  methocarbamol (ROBAXIN) 750 MG tablet Take 750 mg by mouth 3 (three) times daily.   Yes [provider]  montelukast (SINGULAIR) 10 MG tablet Take 10 mg by mouth at bedtime.   Yes [provider]  omeprazole (PRILOSEC) 20 MG capsule Take 20 mg by mouth daily.   Yes [provider]  prazosin (MINIPRESS) 2 MG capsule Take 2 mg by mouth daily. 03/27/17  Yes [provider]  hydrochlorothiazide (HYDRODIURIL) 25 MG tablet Take 25 mg by mouth daily.    [provider]  vitamin E 400 UNIT capsule Take 1 capsule by mouth daily.    [provider]    Allergies Primaquine and Aspirin  Family History  Problem Relation Age of Onset  . Hypertension Mother   . Diabetes Mother   . Hypertension Father     Social History Social History   Tobacco Use  . Smoking status: Current Every Day Smoker    Packs/day: 1.00    Types: Cigarettes  . Smokeless tobacco: Former Engineer, water Use Topics  . Alcohol use: Yes  . Drug  use: No    Review of Systems Constitutional: No fever/chills Eyes: No visual changes. ENT: No sore throat. Cardiovascular: Chest pain as described above Respiratory: Shortness of breath associated with his chest pain Gastrointestinal: Diffuse abdominal tenderness for several days with acute onset nausea and vomiting tonight associated with the chest pain.  Also had nausea, vomiting, and diarrhea several days ago. Genitourinary: Negative for dysuria. Musculoskeletal: Negative for neck pain.  Negative for back pain. Integumentary: Negative for rash. Neurological: Negative for headaches, focal weakness or  numbness.  ____________________________________________   PHYSICAL EXAM:  VITAL SIGNS: ED Triage Vitals  Enc Vitals Group     BP 05/14/17 0154 104/64     Pulse Rate 05/14/17 0154 (!) 110     Resp 05/14/17 0154 (!) 2     Temp 05/14/17 0154 97.9 F (36.6 C)     Temp Source 05/14/17 0154 Oral     SpO2 05/14/17 0154 97 %     Weight 05/14/17 0152 102.5 kg (226 lb)     Height 05/14/17 0152 1.778 m ( )     Head Circumference --      Peak Flow --      Pain Score 05/14/17 0152 5     Pain Loc --      Pain Edu? --      Excl. in GC? --     Constitutional: Alert and oriented.  Appears ill but nontoxic. Eyes: Conjunctivae are normal.  Head: Atraumatic. Nose: No congestion/rhinnorhea. Mouth/Throat: Mucous membranes are moist. Neck: No stridor.  No meningeal signs.   Cardiovascular: Tachycardia, regular rhythm. Good peripheral circulation. Grossly normal heart sounds. Respiratory: Normal respiratory effort.  No retractions. Lungs CTAB. Gastrointestinal: Soft with diffuse tenderness throughout the abdomen but no focal tenderness and no distention.  No rebound no guarding.  Actively vomiting during my examination Musculoskeletal: No lower extremity tenderness nor edema. No gross deformities of extremities. Neurologic:  Normal speech and language. No gross focal neurologic deficits are appreciated.  Skin:  Skin is warm, dry and intact. No rash noted. Psychiatric: Mood and affect are normal. Speech and behavior are normal.  ____________________________________________   LABS (all labs ordered are listed, but only abnormal results are displayed)  Labs Reviewed  CBC WITH DIFFERENTIAL/PLATELET - Abnormal; Notable for the following components:      Result Value   WBC 12.6 (*)    Neutro Abs 8.1 (*)    Monocytes Absolute 1.1 (*)    All other components within normal limits  COMPREHENSIVE METABOLIC PANEL - Abnormal; Notable for the following components:   Sodium 134 (*)    Potassium  3.3 (*)    Chloride 98 (*)    Glucose, Bld 209 (*)    Calcium 8.8 (*)    All other components within normal limits  LIPASE, BLOOD - Abnormal; Notable for the following components:   Lipase 1,579 (*)    All other components within normal limits  TROPONIN I  MAGNESIUM  LIPID PANEL   ____________________________________________  EKG  ED ECG REPORT I, Loleta Rose, the attending physician, personally viewed and interpreted this ECG.  Date: 05/14/2017 EKG Time: 1:54 AM Rate: 118 Rhythm: Sinus tachycardia QRS Axis: normal Intervals: normal ST/T Wave abnormalities: normal Narrative Interpretation: no evidence of acute ischemia  ____________________________________________  RADIOLOGY I, Loleta Rose, personally viewed and evaluated these images (plain radiographs) as part of my medical decision making, as well as reviewing the written report by the radiologist.  I also discussed the CT scan  with the radiologist by phone.  ED MD interpretation: No indication of any acute cardiopulmonary abnormalities on chest x-rays.  No indication of any acute infectious nor inflammatory process on CT abd/pelvis.  Official radiology report(s): Dg Chest 2 View  Result Date: 05/14/2017 CLINICAL DATA:  Chest pain EXAM: CHEST - 2 VIEW COMPARISON:  05/13/2010 FINDINGS: Linear atelectasis or scar at the left base and lingula. No acute consolidation or effusion. Cardiomediastinal silhouette within normal limits. No pneumothorax. Surgical changes in the cervical spine. IMPRESSION: No active cardiopulmonary disease. Minimal scarring or atelectasis at the lingula base. Electronically Signed   By: Jasmine Pang M.D.   On: 05/14/2017 02:05   Ct Abdomen Pelvis W Contrast  Result Date: 05/14/2017 CLINICAL DATA:  Chest pain radiating to the upper abdomen since 11:30 p.m. Shortness of breath. Nausea. Vomiting. EXAM: CT ABDOMEN AND PELVIS WITH CONTRAST TECHNIQUE: Multidetector CT imaging of the abdomen and pelvis  was performed using the standard protocol following bolus administration of intravenous contrast. CONTRAST:  75mL ISOVUE-370 IOPAMIDOL (ISOVUE-370) INJECTION 76% COMPARISON:  None. FINDINGS: Lower chest: Atelectasis in the lung bases. Hepatobiliary: No focal liver abnormality is seen. No gallstones, gallbladder wall thickening, or biliary dilatation. Pancreas: Unremarkable. No pancreatic ductal dilatation or surrounding inflammatory changes. Spleen: Normal in size without focal abnormality. Adrenals/Urinary Tract: No adrenal gland nodules. Subcentimeter cysts in both kidneys. 2 mm stone in the lower pole right kidney. Nephrograms are symmetrical. No hydronephrosis or hydroureter. No ureteral stones or bladder stones. Bladder wall is not thickened. Stomach/Bowel: Stomach is within normal limits. Appendix appears normal. No evidence of bowel wall thickening, distention, or inflammatory changes. Vascular/Lymphatic: Aortic atherosclerosis. No enlarged abdominal or pelvic lymph nodes. Reproductive: Prostate is unremarkable. Other: No abdominal wall hernia or abnormality. No abdominopelvic ascites. Musculoskeletal: No acute or significant osseous findings. IMPRESSION: 1. No acute process demonstrated in the abdomen or pelvis. No bowel obstruction or inflammation. 2. Atelectasis in the lung bases. 3. 2 mm nonobstructing stone in the lower pole right kidney. 4. Aortic atherosclerosis. Electronically Signed   By: Burman Nieves M.D.   On: 05/14/2017 04:40    ____________________________________________   PROCEDURES  Critical Care performed: No   Procedure(s) performed:   Procedures   ____________________________________________   INITIAL IMPRESSION / ASSESSMENT AND PLAN / ED COURSE  As part of my medical decision making, I reviewed the following data within the electronic MEDICAL RECORD NUMBER Nursing notes reviewed and incorporated, Labs reviewed , EKG interpreted , Old chart reviewed, Discussed with  admitting physician , Discussed with radiologist and Notes from prior ED visits  The patient is ill-appearing and vomiting at this time.  Differential diagnosis includes, but is not limited to, ACS, pneumonia, viral gastroenteritis that is leading to volume depletion and overall ill appearance, aortic dissection, acute intra-abdominal infection such as appendicitis or diverticulitis.  Based on his current presentation I am most concerned about ACS although his EKG is normal except for tachycardia.  His chest radiographs are within normal limits with no sign of acute infection.  He has tachycardia and borderline hypotension and is afebrile.  I am giving him 1 L of normal saline bolus and Zofran 4 mg IV.  I will hold off on any analgesia at this time given the low blood pressure until we see if his blood pressure comes up with fluids.  He likely is volume depleted after a weekend of gastroenteritis-like symptoms, and if this is ACS we should support his preload with additional fluids.  Given that he  is actively vomiting we will not try to give him any aspirin at this time.  Lab work is pending.  Based on the results and his clinical presentation I will decide if additional imaging such as a CTA is necessary.  Clinical Course as of May 14 521  Tue May 14, 2017  0308 WBC(!): 12.6 [CF]  0309 Potassium(!): 3.3 [CF]  0309 normal troponin which is reassuring  Troponin I: <0.03 [CF]  0309 normal renal function  Creatinine: 0.85 [CF]  0403 BP has improved and patient is feeling better.  No more chest pain.  No more N/V, will try some ice chips.  Given recent GI issues, abdominal pain, generalized tenderness, N/V, leukocytosis, initial tachycardia and hypotension, will evaluate with CT scan of the abd/pelvis to rule out acute intraabdominal infection, less likely SBO/ileus.   [CF]  0435 Lipase indicates pancreatitis, which is consistent with his symptoms.  Lipase(!): 1,579 [CF]  0448 No evidence of biliary  abnormality on CT abd/pelvis, and no evidence of acute infection, pseudocyst, etc. I did call and discuss the case with the radiologist, Dr. Andria Meuse, who interpreted the CT scan, and he reiterated that there is no evidence of any biliary abnormality nor pancreatic inflammation which is reassuring.  I do not feel there is any indication for a right upper quadrant ultrasound at this time given the reassuring CT scan.  Will update patient and contact the hospitalist.  CT ABDOMEN PELVIS W CONTRAST [CF]  0507 Discussed case by phone with the hospitalist who will admit.  I also added on a lipid panel after my discussion with him, but obviously these results will take some time to come back and should not affect the disposition.   [CF]    Clinical Course User Index [CF] Loleta Rose, MD    ____________________________________________  FINAL CLINICAL IMPRESSION(S) / ED DIAGNOSES  Final diagnoses:  Acute pancreatitis without infection or necrosis, unspecified pancreatitis type     MEDICATIONS GIVEN DURING THIS VISIT:  Medications  sodium chloride 0.9 % bolus 1,000 mL (0 mLs Intravenous Stopped 05/14/17 0349)  ondansetron (ZOFRAN) injection 4 mg (4 mg Intravenous Given 05/14/17 0229)  lactated ringers infusion 1,000 mL (1,000 mLs Intravenous New Bag/Given 05/14/17 0417)  iopamidol (ISOVUE-370) 76 % injection 75 mL (75 mLs Intravenous Contrast Given 05/14/17 0414)     ED Discharge Orders    None       Note:  This document was prepared using Dragon voice recognition software and may include unintentional dictation errors.    Loleta Rose, MD 05/14/17 [phone]

## 2017-05-14 NOTE — ED Notes (Signed)
Pt reports having chest pain and sob started around 11:00pm  Pt alert and oriented x 4. Family at bedside.

## 2017-05-14 NOTE — ED Notes (Signed)
Pt ambulatory upon discharge; declined wheel chair. Dr. York Cerise explained leaving AMA and what that could mean to pt. Pt verbalized understanding of leaving against medical advice, discharge instructions, follow-up care, pain management and prescriptions. VSS. Skin warm and dry. A&O x4.

## 2017-05-14 NOTE — Discharge Instructions (Addendum)
As we discussed, pancreatitis is a serious condition and can lead to long-term disability or death if not appropriately treated.  We offered admission and you spoke with the hospitalist but you have decided to leave AGAINST MEDICAL ADVICE.  Please remember that you can return at any time for further evaluation and possible admission based on your clinical status at that time.  Please read through the included information and follow-up with your doctor today as planned.  Return immediately or call 911 if you develop any new or worsening symptoms.

## 2017-05-14 NOTE — Discharge Summary (Signed)
[facility] - [place] at [facility]   PATIENT NAME: George Downs    MR#:  [id number]  DATE OF BIRTH:  [birth year]  DATE OF ADMISSION:  05/14/2017  PRIMARY CARE PHYSICIAN: System, Provider Not In   REQUESTING/REFERRING PHYSICIAN:  Loleta Rose, MD   CHIEF COMPLAINT:   Chief Complaint  Patient presents with  . Chest Pain    HISTORY OF PRESENT ILLNESS:  George Downs  is a 47 y.o. male with a known history of obesity, T2NIDDM, HTN, HLD, psoriasis/psoriatic arthritis, OSA (CPAP), depression, insomnia, tobacco use D/O who p/w pancreatitis (Lipase 1579). Presenting complaint was upper abdominal/lower chest pain w/ nausea/vomiting, onset Friday 05/10/2017. Pt denies EtOH use. ED imaging (-) gallstones. Pt on Humira for Psoriasis, states it was restarted 5wks ago by Dermatology. Pt has a Dermatology appointment today (Tuesday 05/14/2017) in the afternoon. He states he has four dogs to take care of as well, and wishes to go home. He is aware of the potential benefits of hospitalization and the potential risks of leaving against medical advice. He states his father died of pancreatitis, albeit in the context of heavy EtOH abuse. Pt appears to have insight into his condition and the potential complications of his leaving the hospital. He states he has been pain-free for 3-4 hours, and has not had any nausea or vomiting. He states he is ready to eat/drink. I performed a quick physical examination, which was relatively benign. Vitals are stable at the time of my examination. Pt asymptomatic, well-appearing. Dr. York Cerise (EM) also discussed disposition w/ the pt, and the pt has ultimately decided to leave AMA.  PAST MEDICAL HISTORY:   Past Medical History:  Diagnosis Date  . Diabetes mellitus without complication (HCC)   . Hypertension     PAST SURGICAL HISTORY:   Past Surgical History:  Procedure Laterality Date  . KNEE SURGERY Left   . NECK SURGERY    . SHOULDER SURGERY  Right     SOCIAL HISTORY:   Social History   Tobacco Use  . Smoking status: Current Every Day Smoker    Packs/day: 1.00    Types: Cigarettes  . Smokeless tobacco: Former Engineer, water Use Topics  . Alcohol use: Yes    FAMILY HISTORY:   Family History  Problem Relation Age of Onset  . Hypertension Mother   . Diabetes Mother   . Hypertension Father     DRUG ALLERGIES:   Allergies  Allergen Reactions  . Primaquine     Other reaction(s): OTHER  . Aspirin Palpitations    REVIEW OF SYSTEMS:   Review of Systems  Cardiovascular: Positive for chest pain.  Gastrointestinal: Positive for abdominal pain, nausea and vomiting.    MEDICATIONS AT HOME:   Prior to Admission medications   Medication Sig Start Date End Date Taking? Authorizing Provider  atorvastatin (LIPITOR) 40 MG tablet Take 40 mg by mouth daily. 05/02/17  Yes [provider]  DULoxetine (CYMBALTA) 20 MG capsule Take 20 mg by mouth daily. 05/01/17  Yes [provider]  hydrOXYzine (ATARAX/VISTARIL) 50 MG tablet Take 50 mg by mouth 2 (two) times daily as needed.   Yes [provider]  lisinopril (PRINIVIL,ZESTRIL) 20 MG tablet Take 20 mg by mouth daily. 03/12/17  Yes [provider]  metFORMIN (GLUCOPHAGE) 500 MG tablet Take 2 tablets by mouth 2 (two) times daily. 04/08/17  Yes [provider]  methocarbamol (ROBAXIN) 750 MG tablet Take 750 mg by mouth 3 (three) times daily.  Yes [provider]  montelukast (SINGULAIR) 10 MG tablet Take 10 mg by mouth at bedtime.   Yes [provider]  omeprazole (PRILOSEC) 20 MG capsule Take 20 mg by mouth daily.   Yes [provider]  prazosin (MINIPRESS) 2 MG capsule Take 2 mg by mouth daily. 03/27/17  Yes [provider]  hydrochlorothiazide (HYDRODIURIL) 25 MG tablet Take 25 mg by mouth daily.    [provider]  vitamin E 400 UNIT capsule Take 1 capsule by mouth daily.    [provider]      VITAL SIGNS:  Blood pressure 114/79, pulse 85, temperature 97.9 F (36.6 C), temperature source Oral, resp. rate 19, height  (1.778 m), weight 102.5 kg (226 lb), SpO2 95 %.  PHYSICAL EXAMINATION:  Physical Exam  Constitutional: He is oriented to person, place, and time. He appears well-developed and well-nourished. No distress.  HENT:  Head: Normocephalic and atraumatic.  Mouth/Throat: No oropharyngeal exudate.  Eyes: EOM are normal. No scleral icterus.  Neck: Neck supple. No JVD present. No thyromegaly present.  Cardiovascular: Normal rate, regular rhythm, S1 normal, S2 normal and normal heart sounds.  No extrasystoles are present. Exam reveals no gallop, no S3, no S4 and no distant heart sounds.  No murmur heard. Pulmonary/Chest: Breath sounds normal. No accessory muscle usage or stridor. No apnea, no tachypnea and no bradypnea. No respiratory distress. He has no decreased breath sounds. He has no wheezes. He has no rhonchi. He has no rales.  Abdominal: Soft. Normal appearance and bowel sounds are normal. He exhibits no distension, no ascites and no mass. There is no tenderness. There is no rebound.  Musculoskeletal: He exhibits no edema.  Lymphadenopathy:    He has no cervical adenopathy.  Neurological: He is alert and oriented to person, place, and time.  Skin: Skin is warm and dry. No rash noted. He is not diaphoretic. No erythema.  Psychiatric: He has a normal mood and affect. His behavior is normal. Judgment and thought content normal.   LABORATORY PANEL:   CBC Recent Labs  Lab 05/14/17 0208  WBC 12.6*  HGB 15.3  HCT 43.7  PLT 270   ------------------------------------------------------------------------------------------------------------------  Chemistries  Recent Labs  Lab 05/14/17 0208  NA 134*  K 3.3*  CL 98*  CO2 25  GLUCOSE 209*  BUN 12  CREATININE 0.85  CALCIUM 8.8*  MG 1.8  AST 27  ALT 24  ALKPHOS 101  BILITOT 0.3    ------------------------------------------------------------------------------------------------------------------  Cardiac Enzymes Recent Labs  Lab 05/14/17 0208  TROPONINI <0.03   ------------------------------------------------------------------------------------------------------------------  RADIOLOGY:  Dg Chest 2 View  Result Date: 05/14/2017 CLINICAL DATA:  Chest pain EXAM: CHEST - 2 VIEW COMPARISON:  05/13/2010 FINDINGS: Linear atelectasis or scar at the left base and lingula. No acute consolidation or effusion. Cardiomediastinal silhouette within normal limits. No pneumothorax. Surgical changes in the cervical spine. IMPRESSION: No active cardiopulmonary disease. Minimal scarring or atelectasis at the lingula base. Electronically Signed   By: Jasmine Pang M.D.   On: 05/14/2017 02:05   Ct Abdomen Pelvis W Contrast  Result Date: 05/14/2017 CLINICAL DATA:  Chest pain radiating to the upper abdomen since 11:30 p.m. Shortness of breath. Nausea. Vomiting. EXAM: CT ABDOMEN AND PELVIS WITH CONTRAST TECHNIQUE: Multidetector CT imaging of the abdomen and pelvis was performed using the standard protocol following bolus administration of intravenous contrast. CONTRAST:  75mL ISOVUE-370 IOPAMIDOL (ISOVUE-370) INJECTION 76% COMPARISON:  None. FINDINGS: Lower chest: Atelectasis in the lung bases.  Hepatobiliary: No focal liver abnormality is seen. No gallstones, gallbladder wall thickening, or biliary dilatation. Pancreas: Unremarkable. No pancreatic ductal dilatation or surrounding inflammatory changes. Spleen: Normal in size without focal abnormality. Adrenals/Urinary Tract: No adrenal gland nodules. Subcentimeter cysts in both kidneys. 2 mm stone in the lower pole right kidney. Nephrograms are symmetrical. No hydronephrosis or hydroureter. No ureteral stones or bladder stones. Bladder wall is not thickened. Stomach/Bowel: Stomach is within normal limits. Appendix appears normal. No evidence of  bowel wall thickening, distention, or inflammatory changes. Vascular/Lymphatic: Aortic atherosclerosis. No enlarged abdominal or pelvic lymph nodes. Reproductive: Prostate is unremarkable. Other: No abdominal wall hernia or abnormality. No abdominopelvic ascites. Musculoskeletal: No acute or significant osseous findings. IMPRESSION: 1. No acute process demonstrated in the abdomen or pelvis. No bowel obstruction or inflammation. 2. Atelectasis in the lung bases. 3. 2 mm nonobstructing stone in the lower pole right kidney. 4. Aortic atherosclerosis. Electronically Signed   By: Burman Nieves M.D.   On: 05/14/2017 04:40   IMPRESSION AND PLAN:   A/P: 28M acute pancreatitis, likely drug-induced.  1.) Acute pancreatitis: AP/N/V x4-5d. Lipase 1579. (-) gallstones, (-) EtOH, (-) trauma. Imaging (-). Likely 2/2 resumption of Humira 5wks ago. Pt to leave AMA, to see Dermatology this afternoon (Tuesday 05/04/2017) and discuss this complication.  PATIENT TO LEAVE HOSPITAL AGAINST MEDICAL ADVICE.   All the records are reviewed and case discussed with ED provider. Management plans discussed with the patient, family and they are in agreement.  CODE STATUS: Full code.  TOTAL TIME TAKING CARE OF THIS PATIENT: 20 minutes.    Barbaraann Rondo M.D on 05/14/2017 at 6:59 AM  Between 7am to 6pm - Pager - [phone]  After 6pm go to [email or web address] - Social research officer, government  [facility] [place] [facility]  Office  [phone]  CC: Primary care physician; System, Provider Not In   Note: This dictation was prepared with Dragon dictation along with smaller phrase technology. Any transcriptional errors that result from this process are unintentional.

## 2017-05-14 NOTE — ED Triage Notes (Signed)
Pt to triage via w/c with no distress noted; pt reports upper CP radiating into lateral ribs and upper abd since 1130pm accomp by [facility]

## 2017-05-15 ENCOUNTER — Ambulatory Visit: Admit: 2017-05-15 | Discharge: 2017-05-15 | Payer: MEDICARE | Attending: Family | Primary: Family

## 2017-05-15 ENCOUNTER — Ambulatory Visit: Admit: 2017-05-15 | Discharge: 2017-05-15 | Payer: MEDICARE

## 2017-05-15 DIAGNOSIS — K858 Other acute pancreatitis without necrosis or infection: Principal | ICD-10-CM

## 2017-05-15 DIAGNOSIS — R1011 Right upper quadrant pain: Secondary | ICD-10-CM

## 2017-05-15 DIAGNOSIS — G8929 Other chronic pain: Secondary | ICD-10-CM

## 2017-05-15 DIAGNOSIS — M25571 Pain in right ankle and joints of right foot: Principal | ICD-10-CM

## 2017-05-15 DIAGNOSIS — M7751 Other enthesopathy of right foot: Principal | ICD-10-CM

## 2017-05-15 MED ORDER — DICLOFENAC 1 % TOPICAL GEL
2 refills | 0 days | Status: CP
Start: 2017-05-15 — End: ?

## 2017-05-20 ENCOUNTER — Ambulatory Visit: Admit: 2017-05-20 | Discharge: 2017-05-21 | Payer: MEDICARE

## 2017-05-20 DIAGNOSIS — M170 Bilateral primary osteoarthritis of knee: Principal | ICD-10-CM

## 2017-05-20 DIAGNOSIS — M19079 Primary osteoarthritis, unspecified ankle and foot: Secondary | ICD-10-CM

## 2017-05-20 DIAGNOSIS — R748 Abnormal levels of other serum enzymes: Principal | ICD-10-CM

## 2017-05-23 ENCOUNTER — Ambulatory Visit: Admit: 2017-05-23 | Discharge: 2017-05-24 | Payer: MEDICARE

## 2017-05-23 DIAGNOSIS — R1011 Right upper quadrant pain: Secondary | ICD-10-CM

## 2017-05-23 DIAGNOSIS — K858 Other acute pancreatitis without necrosis or infection: Principal | ICD-10-CM

## 2017-06-17 ENCOUNTER — Ambulatory Visit
Admit: 2017-06-17 | Discharge: 2017-07-01 | Payer: MEDICARE | Attending: Rehabilitative and Restorative Service Providers" | Primary: Rehabilitative and Restorative Service Providers"

## 2017-06-17 DIAGNOSIS — G8929 Other chronic pain: Secondary | ICD-10-CM

## 2017-06-17 DIAGNOSIS — M7751 Other enthesopathy of right foot: Principal | ICD-10-CM

## 2017-06-17 DIAGNOSIS — M25571 Pain in right ankle and joints of right foot: Secondary | ICD-10-CM

## 2017-06-24 MED ORDER — HYDROXYZINE HCL 50 MG TABLET
ORAL_TABLET | 1 refills | 0 days | Status: CP
Start: 2017-06-24 — End: 2017-12-08

## 2017-06-25 MED ORDER — MONTELUKAST 10 MG TABLET
ORAL_TABLET | Freq: Every evening | ORAL | 3 refills | 0 days | Status: CP
Start: 2017-06-25 — End: 2018-07-11

## 2017-07-01 DIAGNOSIS — G8929 Other chronic pain: Secondary | ICD-10-CM

## 2017-07-01 DIAGNOSIS — M25571 Pain in right ankle and joints of right foot: Secondary | ICD-10-CM

## 2017-07-01 DIAGNOSIS — M7751 Other enthesopathy of right foot: Principal | ICD-10-CM

## 2017-07-08 ENCOUNTER — Ambulatory Visit
Admit: 2017-07-08 | Discharge: 2017-07-31 | Payer: MEDICARE | Attending: Rehabilitative and Restorative Service Providers" | Primary: Rehabilitative and Restorative Service Providers"

## 2017-07-08 ENCOUNTER — Ambulatory Visit: Admit: 2017-07-08 | Discharge: 2017-07-09 | Payer: MEDICARE

## 2017-07-08 DIAGNOSIS — R748 Abnormal levels of other serum enzymes: Principal | ICD-10-CM

## 2017-07-08 DIAGNOSIS — M7751 Other enthesopathy of right foot: Principal | ICD-10-CM

## 2017-07-08 DIAGNOSIS — E7800 Pure hypercholesterolemia, unspecified: Secondary | ICD-10-CM

## 2017-07-09 ENCOUNTER — Ambulatory Visit: Admit: 2017-07-09 | Discharge: 2017-07-10 | Payer: MEDICARE

## 2017-07-09 DIAGNOSIS — F331 Major depressive disorder, recurrent, moderate: Secondary | ICD-10-CM

## 2017-07-09 DIAGNOSIS — I10 Essential (primary) hypertension: Secondary | ICD-10-CM

## 2017-07-09 DIAGNOSIS — H6123 Impacted cerumen, bilateral: Secondary | ICD-10-CM

## 2017-07-09 DIAGNOSIS — E7800 Pure hypercholesterolemia, unspecified: Secondary | ICD-10-CM

## 2017-07-09 DIAGNOSIS — E119 Type 2 diabetes mellitus without complications: Secondary | ICD-10-CM

## 2017-07-09 DIAGNOSIS — H938X3 Other specified disorders of ear, bilateral: Principal | ICD-10-CM

## 2017-07-09 MED ORDER — METFORMIN ER 500 MG TABLET,EXTENDED RELEASE 24 HR
ORAL_TABLET | Freq: Every day | ORAL | 3 refills | 0 days | Status: CP
Start: 2017-07-09 — End: 2018-05-09

## 2017-07-09 MED ORDER — CIPROFLOXACIN 0.3 %-DEXAMETHASONE 0.1 % EAR DROPS,SUSPENSION
Freq: Two times a day (BID) | OTIC | 0 refills | 0.00000 days | Status: CP
Start: 2017-07-09 — End: 2017-07-19

## 2017-07-16 ENCOUNTER — Ambulatory Visit: Admit: 2017-07-16 | Discharge: 2017-07-17 | Payer: MEDICARE

## 2017-07-16 DIAGNOSIS — Z79899 Other long term (current) drug therapy: Principal | ICD-10-CM

## 2017-07-24 ENCOUNTER — Ambulatory Visit: Admit: 2017-07-24 | Discharge: 2017-07-24 | Payer: MEDICARE

## 2017-07-24 ENCOUNTER — Other Ambulatory Visit: Admit: 2017-07-24 | Discharge: 2017-07-24 | Payer: MEDICARE

## 2017-07-24 DIAGNOSIS — H6123 Impacted cerumen, bilateral: Principal | ICD-10-CM

## 2017-07-24 DIAGNOSIS — Z79899 Other long term (current) drug therapy: Principal | ICD-10-CM

## 2017-07-29 MED ORDER — SECUKINUMAB 150 MG/ML SUBCUTANEOUS PEN INJECTOR: mL | 1 refills | 0 days

## 2017-07-29 MED ORDER — SECUKINUMAB 150 MG/ML SUBCUTANEOUS PEN INJECTOR
SUBCUTANEOUS | 1 refills | 0.00000 days | Status: CP
Start: 2017-07-29 — End: 2017-07-29

## 2017-07-29 NOTE — Unmapped (Signed)
Per test claim for Cosentyx at the [facility], patient needs Medication Assistance Program for Prior Authorization.

## 2017-07-29 NOTE — Unmapped (Signed)
I called and left a voicemail with Douglas Hernandez, notifying him of his good QuantiFERON result.  I have sent in a prescription for Cosentyx as discussed.

## 2017-07-30 NOTE — Unmapped (Signed)
[facility] Medication Referral: PA APPROVED    Medication (Brand/Generic): COSENTYX    Initial FSI Test Claim completed with resulted information below:  No PA required  Patient ABLE to fill at [facility]  Insurance Company:  [employer]  Anticipated Copay: $28  Is anticipated copay with a copay card or grant? NO    As Co-pay is under $100 defined limit, per policy there will be no further investigation of need for financial assistance at this time unless patient requests. This referral has been communicated to the provider and handed off to the [facility] team for further processing and filling of prescribed medication.   ______________________________________________________________________  Please utilize this referral for viewing purposes as it will serve as the central location for all relevant documentation and updates.

## 2017-07-31 MED ORDER — SECUKINUMAB 150 MG/ML SUBCUTANEOUS PEN INJECTOR
INJECTION | 1 refills | 0 days | Status: CP
Start: 2017-07-31 — End: 2018-07-07

## 2017-09-30 MED ORDER — METHOCARBAMOL 750 MG TABLET
ORAL_TABLET | Freq: Two times a day (BID) | ORAL | 1 refills | 0.00000 days | Status: CP
Start: 2017-09-30 — End: 2017-12-03

## 2017-12-03 MED ORDER — ATORVASTATIN 40 MG TABLET
ORAL_TABLET | 3 refills | 0 days | Status: CP
Start: 2017-12-03 — End: ?

## 2017-12-03 MED ORDER — BACLOFEN 10 MG TABLET
ORAL_TABLET | 3 refills | 0 days | Status: CP
Start: 2017-12-03 — End: 2018-05-09

## 2017-12-11 MED ORDER — LISINOPRIL 20 MG TABLET
ORAL_TABLET | 3 refills | 0 days | Status: CP
Start: 2017-12-11 — End: 2018-07-02

## 2017-12-11 MED ORDER — HYDROXYZINE HCL 50 MG TABLET
ORAL_TABLET | 3 refills | 0 days | Status: CP
Start: 2017-12-11 — End: ?

## 2018-03-18 MED ORDER — FLUTICASONE PROPIONATE 50 MCG/ACTUATION NASAL SPRAY,SUSPENSION
4 refills | 0 days | Status: CP
Start: 2018-03-18 — End: ?

## 2018-05-05 MED ORDER — HYDROCHLOROTHIAZIDE 25 MG TABLET
ORAL_TABLET | 0 refills | 0 days | Status: CP
Start: 2018-05-05 — End: 2018-05-12

## 2018-05-09 ENCOUNTER — Telehealth: Admit: 2018-05-09 | Discharge: 2018-05-10 | Payer: MEDICARE

## 2018-05-09 DIAGNOSIS — E119 Type 2 diabetes mellitus without complications: Principal | ICD-10-CM

## 2018-05-09 DIAGNOSIS — Z720 Tobacco use: Secondary | ICD-10-CM

## 2018-05-09 DIAGNOSIS — F331 Major depressive disorder, recurrent, moderate: Secondary | ICD-10-CM

## 2018-05-09 DIAGNOSIS — I10 Essential (primary) hypertension: Secondary | ICD-10-CM

## 2018-05-09 DIAGNOSIS — G4733 Obstructive sleep apnea (adult) (pediatric): Secondary | ICD-10-CM

## 2018-05-09 DIAGNOSIS — E7800 Pure hypercholesterolemia, unspecified: Secondary | ICD-10-CM

## 2018-05-09 DIAGNOSIS — Z9989 Dependence on other enabling machines and devices: Secondary | ICD-10-CM

## 2018-05-09 MED ORDER — METFORMIN ER 500 MG TABLET,EXTENDED RELEASE 24 HR
ORAL_TABLET | Freq: Two times a day (BID) | ORAL | 3 refills | 0 days
Start: 2018-05-09 — End: 2018-05-19

## 2018-05-12 ENCOUNTER — Ambulatory Visit: Admit: 2018-05-12 | Discharge: 2018-05-13 | Payer: MEDICARE

## 2018-05-12 DIAGNOSIS — E119 Type 2 diabetes mellitus without complications: Secondary | ICD-10-CM

## 2018-05-12 DIAGNOSIS — I10 Essential (primary) hypertension: Principal | ICD-10-CM

## 2018-05-12 DIAGNOSIS — E7800 Pure hypercholesterolemia, unspecified: Secondary | ICD-10-CM

## 2018-05-12 MED ORDER — HYDROCHLOROTHIAZIDE 25 MG TABLET
ORAL_TABLET | Freq: Every day | ORAL | 0 refills | 0 days
Start: 2018-05-12 — End: 2018-08-13

## 2018-05-19 ENCOUNTER — Telehealth: Admit: 2018-05-19 | Discharge: 2018-05-20 | Payer: MEDICARE

## 2018-05-19 DIAGNOSIS — E7800 Pure hypercholesterolemia, unspecified: Secondary | ICD-10-CM

## 2018-05-19 DIAGNOSIS — E1165 Type 2 diabetes mellitus with hyperglycemia: Principal | ICD-10-CM

## 2018-05-19 DIAGNOSIS — R740 Nonspecific elevation of levels of transaminase and lactic acid dehydrogenase [LDH]: Secondary | ICD-10-CM

## 2018-05-19 MED ORDER — OMEPRAZOLE 40 MG CAPSULE,DELAYED RELEASE
ORAL_CAPSULE | Freq: Every day | ORAL | 3 refills | 0 days | Status: CP
Start: 2018-05-19 — End: ?

## 2018-05-19 MED ORDER — METFORMIN ER 500 MG TABLET,EXTENDED RELEASE 24 HR
ORAL_TABLET | Freq: Every day | ORAL | 3 refills | 0 days | Status: CP
Start: 2018-05-19 — End: 2018-09-17

## 2018-05-28 ENCOUNTER — Ambulatory Visit: Admit: 2018-05-28 | Discharge: 2018-05-29 | Payer: MEDICARE

## 2018-06-11 ENCOUNTER — Ambulatory Visit: Admit: 2018-06-11 | Discharge: 2018-06-12 | Payer: MEDICARE

## 2018-06-25 ENCOUNTER — Ambulatory Visit: Admit: 2018-06-25 | Discharge: 2018-06-26 | Payer: MEDICARE

## 2018-06-30 ENCOUNTER — Ambulatory Visit: Admit: 2018-06-30 | Discharge: 2018-07-01

## 2018-06-30 DIAGNOSIS — I10 Essential (primary) hypertension: Principal | ICD-10-CM

## 2018-06-30 DIAGNOSIS — Z720 Tobacco use: Secondary | ICD-10-CM

## 2018-06-30 DIAGNOSIS — E7800 Pure hypercholesterolemia, unspecified: Secondary | ICD-10-CM

## 2018-07-02 MED ORDER — LISINOPRIL 40 MG TABLET
ORAL_TABLET | Freq: Every day | ORAL | 0 refills | 0.00000 days | Status: CP
Start: 2018-07-02 — End: 2019-07-02

## 2018-07-02 MED ORDER — BLOOD-GLUCOSE METER KIT WRAPPER
0 refills | 0 days | Status: CP
Start: 2018-07-02 — End: 2018-09-11

## 2018-07-02 MED ORDER — BLOOD SUGAR DIAGNOSTIC STRIPS
ORAL_STRIP | 3 refills | 0 days | Status: CP
Start: 2018-07-02 — End: 2018-07-04

## 2018-07-02 MED ORDER — LANCETS
3 refills | 0 days | Status: CP
Start: 2018-07-02 — End: 2018-07-04

## 2018-07-04 MED ORDER — LANCETS
3 refills | 0 days | Status: CP
Start: 2018-07-04 — End: 2018-07-15

## 2018-07-04 MED ORDER — BLOOD SUGAR DIAGNOSTIC STRIPS
ORAL_STRIP | 3 refills | 0 days | Status: CP
Start: 2018-07-04 — End: 2018-07-15

## 2018-07-07 MED ORDER — COSENTYX PEN 300 MG/2 PENS (150 MG/ML) SUBCUTANEOUS
INJECTION | 2 refills | 0 days | Status: CP
Start: 2018-07-07 — End: ?

## 2018-07-09 ENCOUNTER — Ambulatory Visit: Admit: 2018-07-09 | Discharge: 2018-07-10 | Payer: MEDICARE

## 2018-07-11 MED ORDER — MONTELUKAST 10 MG TABLET
ORAL_TABLET | 3 refills | 0 days | Status: CP
Start: 2018-07-11 — End: ?

## 2018-07-15 MED ORDER — BLOOD SUGAR DIAGNOSTIC STRIPS
ORAL_STRIP | 3 refills | 0 days | Status: CP
Start: 2018-07-15 — End: ?

## 2018-07-15 MED ORDER — LANCETS
3 refills | 0 days | Status: CP
Start: 2018-07-15 — End: ?

## 2018-08-11 ENCOUNTER — Ambulatory Visit: Admit: 2018-08-11 | Discharge: 2018-08-12

## 2018-08-11 DIAGNOSIS — I10 Essential (primary) hypertension: Secondary | ICD-10-CM

## 2018-08-11 DIAGNOSIS — E1165 Type 2 diabetes mellitus with hyperglycemia: Principal | ICD-10-CM

## 2018-08-11 DIAGNOSIS — E7800 Pure hypercholesterolemia, unspecified: Secondary | ICD-10-CM

## 2018-08-11 DIAGNOSIS — Z720 Tobacco use: Secondary | ICD-10-CM

## 2018-08-13 MED ORDER — BUPROPION HCL SR 150 MG TABLET,12 HR SUSTAINED-RELEASE
ORAL_TABLET | Freq: Two times a day (BID) | ORAL | 0 refills | 90 days | Status: CP
Start: 2018-08-13 — End: 2019-08-13

## 2018-08-13 MED ORDER — HYDROCHLOROTHIAZIDE 25 MG TABLET
ORAL_TABLET | Freq: Every day | ORAL | 1 refills | 90 days | Status: CP
Start: 2018-08-13 — End: 2019-08-13

## 2018-08-13 MED ORDER — AMLODIPINE 5 MG TABLET
ORAL_TABLET | Freq: Every day | ORAL | 1 refills | 90 days | Status: CP
Start: 2018-08-13 — End: 2019-08-13

## 2018-09-11 ENCOUNTER — Ambulatory Visit: Admit: 2018-09-11 | Discharge: 2018-09-12

## 2018-09-11 DIAGNOSIS — Z720 Tobacco use: Secondary | ICD-10-CM

## 2018-09-11 DIAGNOSIS — E7800 Pure hypercholesterolemia, unspecified: Secondary | ICD-10-CM

## 2018-09-11 DIAGNOSIS — I10 Essential (primary) hypertension: Principal | ICD-10-CM

## 2018-09-11 MED ORDER — BLOOD-GLUCOSE METER KIT WRAPPER
0 refills | 0 days | Status: CP
Start: 2018-09-11 — End: 2019-09-11

## 2018-09-17 ENCOUNTER — Ambulatory Visit: Admit: 2018-09-17 | Discharge: 2018-09-18 | Payer: MEDICARE

## 2018-09-17 DIAGNOSIS — R740 Nonspecific elevation of levels of transaminase and lactic acid dehydrogenase [LDH]: Secondary | ICD-10-CM

## 2018-09-17 DIAGNOSIS — F331 Major depressive disorder, recurrent, moderate: Secondary | ICD-10-CM

## 2018-09-17 DIAGNOSIS — Z720 Tobacco use: Secondary | ICD-10-CM

## 2018-09-17 DIAGNOSIS — R252 Cramp and spasm: Secondary | ICD-10-CM

## 2018-09-17 DIAGNOSIS — E7800 Pure hypercholesterolemia, unspecified: Secondary | ICD-10-CM

## 2018-09-17 DIAGNOSIS — E1165 Type 2 diabetes mellitus with hyperglycemia: Secondary | ICD-10-CM

## 2018-09-17 DIAGNOSIS — I10 Essential (primary) hypertension: Secondary | ICD-10-CM

## 2018-09-17 DIAGNOSIS — Z1211 Encounter for screening for malignant neoplasm of colon: Secondary | ICD-10-CM

## 2018-09-17 MED ORDER — EMPAGLIFLOZIN 10 MG TABLET
ORAL_TABLET | Freq: Every day | ORAL | 3 refills | 90 days | Status: CP
Start: 2018-09-17 — End: ?

## 2018-09-17 MED ORDER — METFORMIN ER 500 MG TABLET,EXTENDED RELEASE 24 HR
ORAL_TABLET | Freq: Two times a day (BID) | ORAL | 3 refills | 90 days
Start: 2018-09-17 — End: 2019-09-12

## 2018-09-26 DIAGNOSIS — I10 Essential (primary) hypertension: Secondary | ICD-10-CM

## 2018-09-26 MED ORDER — LISINOPRIL 40 MG TABLET
ORAL_TABLET | 3 refills | 0 days | Status: CP
Start: 2018-09-26 — End: ?

## 2018-10-24 DIAGNOSIS — L409 Psoriasis, unspecified: Secondary | ICD-10-CM

## 2018-11-18 ENCOUNTER — Ambulatory Visit: Admit: 2018-11-18 | Discharge: 2018-11-19 | Payer: MEDICARE

## 2018-11-18 MED ORDER — COSENTYX PEN 300 MG/2 PENS (150 MG/ML) SUBCUTANEOUS
INJECTION | PRN refills | 0 days | Status: CP
Start: 2018-11-18 — End: ?

## 2018-11-19 DIAGNOSIS — L409 Psoriasis, unspecified: Principal | ICD-10-CM

## 2018-11-19 MED ORDER — COSENTYX PEN 300 MG/2 PENS (150 MG/ML) SUBCUTANEOUS
PRN refills | 0 days | Status: CP
Start: 2018-11-19 — End: ?

## 2018-12-03 MED ORDER — METHOCARBAMOL 750 MG TABLET
ORAL_TABLET | Freq: Three times a day (TID) | ORAL | 1 refills | 90.00000 days | Status: CP | PRN
Start: 2018-12-03 — End: 2019-12-03

## 2018-12-04 ENCOUNTER — Ambulatory Visit: Admit: 2018-12-04 | Discharge: 2018-12-05

## 2018-12-04 MED ORDER — METHOCARBAMOL 750 MG TABLET
ORAL_TABLET | Freq: Two times a day (BID) | ORAL | 3 refills | 90 days
Start: 2018-12-04 — End: 2019-12-04

## 2018-12-05 IMAGING — CR DG CHEST 2V
2 series · 2 of 2 positions shown · non-contrast
Comparison: 05/13/2010

CLINICAL DATA: Chest pain

EXAM:
CHEST - 2 VIEW

[chest pa]
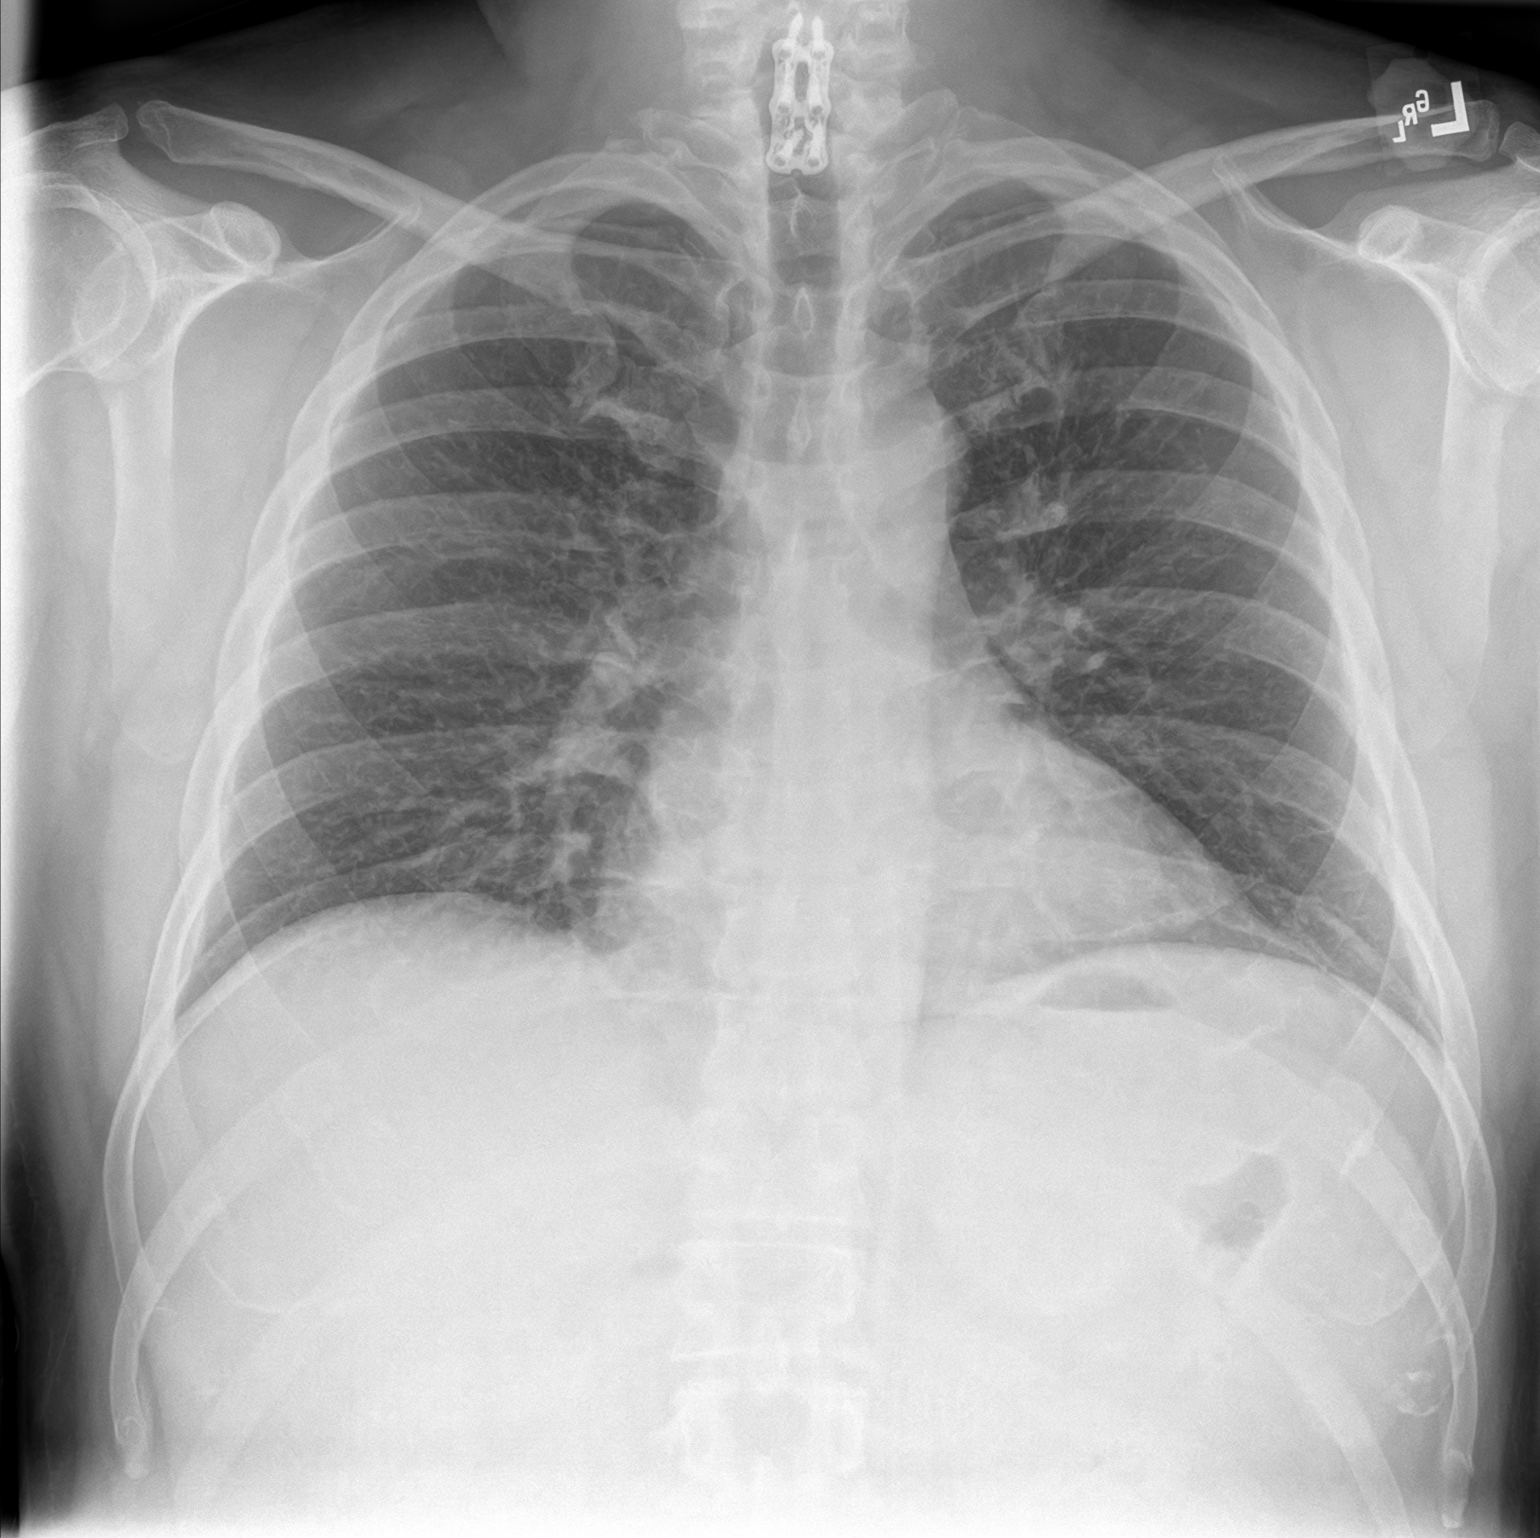

[chest lat]
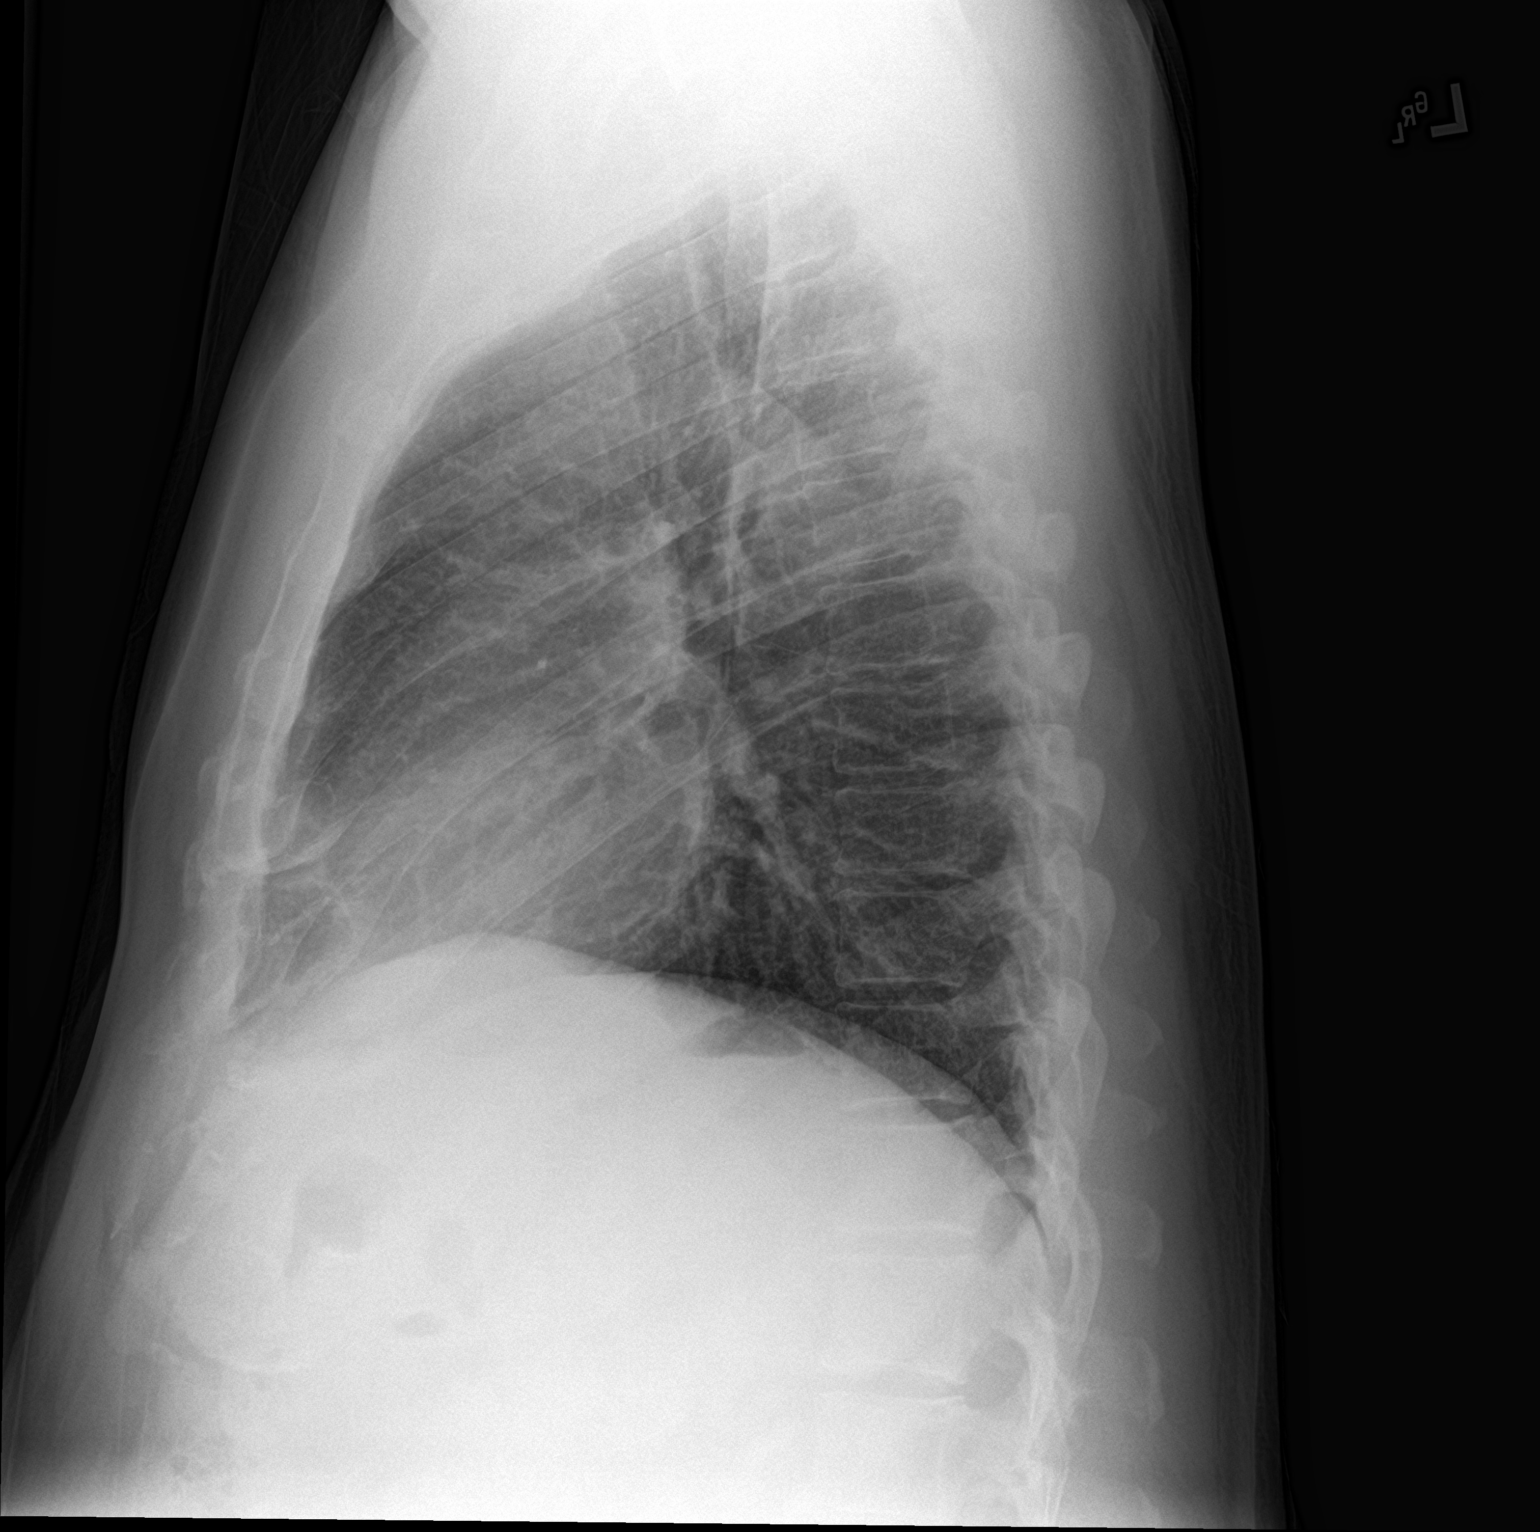

[2 of 2 positions shown; findings below may reference images not displayed]

FINDINGS: Linear atelectasis or scar at the left base and lingula. No acute
consolidation or effusion. Cardiomediastinal silhouette within
normal limits. No pneumothorax. Surgical changes in the cervical
spine.
IMPRESSION: No active cardiopulmonary disease. Minimal scarring or atelectasis
at the lingula base.

## 2018-12-31 DIAGNOSIS — I10 Essential (primary) hypertension: Principal | ICD-10-CM

## 2019-01-01 MED ORDER — HYDROCHLOROTHIAZIDE 25 MG TABLET
ORAL_TABLET | Freq: Every day | ORAL | 3 refills | 90 days | Status: CP
Start: 2019-01-01 — End: 2020-01-01

## 2019-01-22 DIAGNOSIS — I10 Essential (primary) hypertension: Principal | ICD-10-CM

## 2019-01-22 MED ORDER — AMLODIPINE 5 MG TABLET
ORAL_TABLET | Freq: Every day | ORAL | 3 refills | 90 days | Status: CP
Start: 2019-01-22 — End: 2020-01-22

## 2019-02-02 MED ORDER — OMEPRAZOLE 40 MG CAPSULE,DELAYED RELEASE
ORAL_CAPSULE | 3 refills | 0 days | Status: CP
Start: 2019-02-02 — End: ?

## 2019-03-05 ENCOUNTER — Ambulatory Visit: Admit: 2019-03-05 | Discharge: 2019-03-06

## 2019-03-06 MED ORDER — LORATADINE 10 MG TABLET
ORAL_TABLET | Freq: Every day | ORAL | 3 refills | 90 days | Status: CP
Start: 2019-03-06 — End: 2020-03-05

## 2019-03-31 MED ORDER — LORATADINE 10 MG TABLET
ORAL_TABLET | Freq: Every day | ORAL | 3 refills | 90 days | Status: CP
Start: 2019-03-31 — End: 2020-03-30

## 2019-06-04 ENCOUNTER — Ambulatory Visit: Admit: 2019-06-04 | Discharge: 2019-06-05

## 2019-07-01 MED ORDER — MONTELUKAST 10 MG TABLET
ORAL_TABLET | 3 refills | 0 days | Status: CP
Start: 2019-07-01 — End: ?

## 2019-07-01 MED ORDER — METHOCARBAMOL 750 MG TABLET
ORAL_TABLET | 3 refills | 0 days | Status: CP
Start: 2019-07-01 — End: ?

## 2019-07-03 ENCOUNTER — Ambulatory Visit: Admit: 2019-07-03 | Discharge: 2019-07-04 | Payer: MEDICARE

## 2019-07-03 DIAGNOSIS — Z7289 Other problems related to lifestyle: Principal | ICD-10-CM

## 2019-07-03 DIAGNOSIS — Z720 Tobacco use: Principal | ICD-10-CM

## 2019-07-03 DIAGNOSIS — M659 Synovitis and tenosynovitis, unspecified: Principal | ICD-10-CM

## 2019-07-03 DIAGNOSIS — E7800 Pure hypercholesterolemia, unspecified: Principal | ICD-10-CM

## 2019-07-03 DIAGNOSIS — Z1159 Encounter for screening for other viral diseases: Principal | ICD-10-CM

## 2019-07-03 DIAGNOSIS — F331 Major depressive disorder, recurrent, moderate: Principal | ICD-10-CM

## 2019-07-03 DIAGNOSIS — E1165 Type 2 diabetes mellitus with hyperglycemia: Principal | ICD-10-CM

## 2019-07-03 DIAGNOSIS — I10 Essential (primary) hypertension: Principal | ICD-10-CM

## 2019-07-03 DIAGNOSIS — R7989 Other specified abnormal findings of blood chemistry: Principal | ICD-10-CM

## 2019-07-03 MED ORDER — EMPAGLIFLOZIN 25 MG TABLET
ORAL_TABLET | Freq: Every day | ORAL | 3 refills | 90.00000 days | Status: CP
Start: 2019-07-03 — End: ?

## 2019-07-03 MED ORDER — OZEMPIC 0.25 MG OR 0.5 MG (2 MG/1.5 ML) SUBCUTANEOUS PEN INJECTOR
SUBCUTANEOUS | 3 refills | 84 days | Status: CP
Start: 2019-07-03 — End: 2020-07-02

## 2019-07-03 MED ORDER — METHOCARBAMOL 750 MG TABLET
ORAL_TABLET | 3 refills | 0 days | Status: CN
Start: 2019-07-03 — End: ?

## 2019-08-19 DIAGNOSIS — I10 Essential (primary) hypertension: Principal | ICD-10-CM

## 2019-08-19 MED ORDER — LISINOPRIL 40 MG TABLET
ORAL_TABLET | 3 refills | 0 days | Status: CP
Start: 2019-08-19 — End: ?

## 2019-10-02 ENCOUNTER — Ambulatory Visit: Admit: 2019-10-02 | Discharge: 2019-10-03 | Payer: MEDICARE

## 2019-10-02 DIAGNOSIS — L409 Psoriasis, unspecified: Principal | ICD-10-CM

## 2019-10-02 DIAGNOSIS — E6609 Other obesity due to excess calories: Secondary | ICD-10-CM

## 2019-10-02 DIAGNOSIS — F17201 Nicotine dependence, unspecified, in remission: Principal | ICD-10-CM

## 2019-10-02 DIAGNOSIS — I10 Essential (primary) hypertension: Principal | ICD-10-CM

## 2019-10-02 DIAGNOSIS — E7800 Pure hypercholesterolemia, unspecified: Principal | ICD-10-CM

## 2019-10-02 DIAGNOSIS — F331 Major depressive disorder, recurrent, moderate: Principal | ICD-10-CM

## 2019-10-02 DIAGNOSIS — E1165 Type 2 diabetes mellitus with hyperglycemia: Principal | ICD-10-CM

## 2019-10-02 DIAGNOSIS — Z6834 Body mass index (BMI) 34.0-34.9, adult: Secondary | ICD-10-CM

## 2019-10-02 DIAGNOSIS — R7989 Other specified abnormal findings of blood chemistry: Principal | ICD-10-CM

## 2019-10-02 MED ORDER — METFORMIN ER 500 MG TABLET,EXTENDED RELEASE 24 HR
ORAL_TABLET | Freq: Two times a day (BID) | ORAL | 3 refills | 90 days | Status: CP
Start: 2019-10-02 — End: 2020-09-26

## 2019-10-02 MED ORDER — OZEMPIC 0.25 MG OR 0.5 MG (2 MG/1.5 ML) SUBCUTANEOUS PEN INJECTOR
SUBCUTANEOUS | 3 refills | 84 days | Status: CP
Start: 2019-10-02 — End: 2020-10-01

## 2019-10-03 DIAGNOSIS — E1165 Type 2 diabetes mellitus with hyperglycemia: Principal | ICD-10-CM

## 2019-10-03 MED ORDER — FREESTYLE LITE STRIPS
ORAL_STRIP | 3 refills | 0 days
Start: 2019-10-03 — End: ?

## 2019-10-03 MED ORDER — FREESTYLE LANCETS 28 GAUGE
3 refills | 0 days
Start: 2019-10-03 — End: ?

## 2019-10-05 DIAGNOSIS — E1165 Type 2 diabetes mellitus with hyperglycemia: Principal | ICD-10-CM

## 2019-10-05 MED ORDER — FREESTYLE LITE STRIPS
ORAL_STRIP | 3 refills | 0 days | Status: CP
Start: 2019-10-05 — End: ?

## 2019-10-05 MED ORDER — LANCETS 28 GAUGE
3 refills | 0 days | Status: CP
Start: 2019-10-05 — End: ?

## 2019-10-15 MED ORDER — BYDUREON 2 MG SUBCUTANEOUS EXTENDED RELEASE SUSPENSION
SUBCUTANEOUS | 5 refills | 0.00000 days | Status: CP
Start: 2019-10-15 — End: ?

## 2019-10-29 ENCOUNTER — Ambulatory Visit: Admit: 2019-10-29 | Discharge: 2019-10-30

## 2019-10-29 DIAGNOSIS — I10 Essential (primary) hypertension: Principal | ICD-10-CM

## 2019-10-29 DIAGNOSIS — E1165 Type 2 diabetes mellitus with hyperglycemia: Principal | ICD-10-CM

## 2019-10-29 DIAGNOSIS — E7800 Pure hypercholesterolemia, unspecified: Principal | ICD-10-CM

## 2019-10-29 MED ORDER — BYDUREON 2 MG SUBCUTANEOUS EXTENDED RELEASE SUSPENSION
SUBCUTANEOUS | 1 refills | 0 days | Status: CP
Start: 2019-10-29 — End: 2020-10-28

## 2019-11-02 MED ORDER — ATORVASTATIN 40 MG TABLET
ORAL_TABLET | 3 refills | 0 days | Status: CP
Start: 2019-11-02 — End: ?

## 2019-11-30 ENCOUNTER — Ambulatory Visit: Admit: 2019-11-30 | Discharge: 2019-12-01 | Payer: MEDICARE | Attending: Internal Medicine | Primary: Internal Medicine

## 2019-11-30 DIAGNOSIS — J069 Acute upper respiratory infection, unspecified: Principal | ICD-10-CM

## 2019-11-30 DIAGNOSIS — L409 Psoriasis, unspecified: Principal | ICD-10-CM

## 2019-11-30 MED ORDER — COSENTYX PEN 300 MG/2 PENS (150 MG/ML) SUBCUTANEOUS
12 refills | 0 days
Start: 2019-11-30 — End: ?

## 2019-12-01 MED ORDER — COSENTYX PEN 300 MG/2 PENS (150 MG/ML) SUBCUTANEOUS
4 refills | 0 days | Status: CP
Start: 2019-12-01 — End: ?

## 2019-12-07 DIAGNOSIS — I10 Essential (primary) hypertension: Principal | ICD-10-CM

## 2019-12-07 MED ORDER — HYDROCHLOROTHIAZIDE 25 MG TABLET
ORAL_TABLET | Freq: Every day | ORAL | 3 refills | 0.00000 days | Status: CP
Start: 2019-12-07 — End: 2020-12-06

## 2019-12-07 MED ORDER — HYDROCHLOROTHIAZIDE 25 MG TABLET: 25 mg | tablet | Freq: Every day | 3 refills | 90 days | Status: AC

## 2019-12-18 DIAGNOSIS — J019 Acute sinusitis, unspecified: Principal | ICD-10-CM

## 2019-12-19 DIAGNOSIS — J329 Chronic sinusitis, unspecified: Principal | ICD-10-CM

## 2019-12-19 MED ORDER — DOXYCYCLINE HYCLATE 100 MG TABLET
ORAL_TABLET | Freq: Two times a day (BID) | ORAL | 0 refills | 7 days | Status: CP
Start: 2019-12-19 — End: 2019-12-26

## 2019-12-19 MED ORDER — AMOXICILLIN 875 MG-POTASSIUM CLAVULANATE 125 MG TABLET
ORAL_TABLET | Freq: Two times a day (BID) | ORAL | 0 refills | 7.00000 days | Status: CP
Start: 2019-12-19 — End: 2019-12-31

## 2019-12-31 ENCOUNTER — Ambulatory Visit: Admit: 2019-12-31 | Discharge: 2020-01-01 | Payer: MEDICARE

## 2019-12-31 DIAGNOSIS — E7800 Pure hypercholesterolemia, unspecified: Principal | ICD-10-CM

## 2019-12-31 DIAGNOSIS — Z720 Tobacco use: Principal | ICD-10-CM

## 2019-12-31 DIAGNOSIS — E1165 Type 2 diabetes mellitus with hyperglycemia: Principal | ICD-10-CM

## 2019-12-31 DIAGNOSIS — R7989 Other specified abnormal findings of blood chemistry: Principal | ICD-10-CM

## 2019-12-31 DIAGNOSIS — I10 Essential (primary) hypertension: Principal | ICD-10-CM

## 2020-01-28 ENCOUNTER — Ambulatory Visit: Admit: 2020-01-28 | Discharge: 2020-01-29 | Payer: MEDICARE

## 2020-01-28 DIAGNOSIS — E7800 Pure hypercholesterolemia, unspecified: Principal | ICD-10-CM

## 2020-01-28 DIAGNOSIS — I10 Essential (primary) hypertension: Principal | ICD-10-CM

## 2020-02-04 ENCOUNTER — Ambulatory Visit: Admit: 2020-02-04 | Discharge: 2020-02-05 | Payer: MEDICARE

## 2020-02-04 DIAGNOSIS — I10 Essential (primary) hypertension: Principal | ICD-10-CM

## 2020-02-22 ENCOUNTER — Ambulatory Visit: Admit: 2020-02-22 | Discharge: 2020-02-23

## 2020-02-22 DIAGNOSIS — I10 Essential (primary) hypertension: Principal | ICD-10-CM

## 2020-03-08 ENCOUNTER — Ambulatory Visit
Admit: 2020-03-08 | Discharge: 2020-03-09 | Payer: MEDICARE | Attending: Geriatric Medicine | Primary: Geriatric Medicine

## 2020-03-08 DIAGNOSIS — D72829 Elevated white blood cell count, unspecified: Principal | ICD-10-CM

## 2020-03-08 DIAGNOSIS — N200 Calculus of kidney: Principal | ICD-10-CM

## 2020-03-08 MED ORDER — CIPROFLOXACIN 500 MG TABLET
ORAL_TABLET | Freq: Two times a day (BID) | ORAL | 0 refills | 14 days | Status: CP
Start: 2020-03-08 — End: ?

## 2020-03-08 MED ORDER — HYDROCODONE 7.5 MG-ACETAMINOPHEN 325 MG TABLET
ORAL_TABLET | Freq: Three times a day (TID) | ORAL | 0 refills | 10.00000 days | Status: CP | PRN
Start: 2020-03-08 — End: 2020-03-08

## 2020-03-08 MED ORDER — TAMSULOSIN 0.4 MG CAPSULE
ORAL_CAPSULE | Freq: Every day | ORAL | 1 refills | 14 days | Status: CP
Start: 2020-03-08 — End: 2021-03-08

## 2020-03-09 DIAGNOSIS — I10 Essential (primary) hypertension: Principal | ICD-10-CM

## 2020-03-09 MED ORDER — AMLODIPINE 5 MG TABLET
ORAL_TABLET | 3 refills | 0 days | Status: CP
Start: 2020-03-09 — End: ?

## 2020-03-11 ENCOUNTER — Ambulatory Visit: Admit: 2020-03-11 | Discharge: 2020-03-12 | Payer: MEDICARE

## 2020-03-11 DIAGNOSIS — D72829 Elevated white blood cell count, unspecified: Principal | ICD-10-CM

## 2020-03-11 DIAGNOSIS — N3001 Acute cystitis with hematuria: Principal | ICD-10-CM

## 2020-03-11 DIAGNOSIS — D75A Glucose-6-phosphate dehydrogenase (G6PD) deficiency without anemia: Principal | ICD-10-CM

## 2020-03-11 MED ORDER — CEFDINIR 300 MG CAPSULE
ORAL_CAPSULE | Freq: Two times a day (BID) | ORAL | 0 refills | 10 days | Status: CP
Start: 2020-03-11 — End: 2020-03-21

## 2020-03-28 MED ORDER — BYDUREON BCISE 2 MG/0.85 ML SUBCUTANEOUS AUTO-INJECTOR
3 refills | 0 days | Status: CP
Start: 2020-03-28 — End: ?

## 2020-04-01 ENCOUNTER — Ambulatory Visit: Admit: 2020-04-01 | Discharge: 2020-04-02 | Payer: MEDICARE

## 2020-04-01 DIAGNOSIS — F331 Major depressive disorder, recurrent, moderate: Principal | ICD-10-CM

## 2020-04-01 DIAGNOSIS — E1165 Type 2 diabetes mellitus with hyperglycemia: Principal | ICD-10-CM

## 2020-04-01 DIAGNOSIS — B070 Plantar wart: Principal | ICD-10-CM

## 2020-04-01 DIAGNOSIS — E7800 Pure hypercholesterolemia, unspecified: Principal | ICD-10-CM

## 2020-04-01 DIAGNOSIS — I10 Essential (primary) hypertension: Principal | ICD-10-CM

## 2020-04-01 DIAGNOSIS — R300 Dysuria: Principal | ICD-10-CM

## 2020-04-04 DIAGNOSIS — N3000 Acute cystitis without hematuria: Principal | ICD-10-CM

## 2020-04-04 DIAGNOSIS — Z1635 Resistance to multiple antimicrobial drugs: Principal | ICD-10-CM

## 2020-04-04 DIAGNOSIS — A499 Bacterial infection, unspecified: Principal | ICD-10-CM

## 2020-04-04 MED ORDER — CEFIXIME 400 MG CAPSULE
ORAL_CAPSULE | Freq: Every day | ORAL | 0 refills | 10 days | Status: CP
Start: 2020-04-04 — End: 2020-04-14

## 2020-04-05 DIAGNOSIS — N3000 Acute cystitis without hematuria: Principal | ICD-10-CM

## 2020-04-05 MED ORDER — LORATADINE 10 MG TABLET
ORAL_TABLET | 3 refills | 0 days | Status: CP
Start: 2020-04-05 — End: ?

## 2020-04-05 MED ORDER — SULFAMETHOXAZOLE 400 MG-TRIMETHOPRIM 80 MG TABLET
ORAL_TABLET | Freq: Two times a day (BID) | ORAL | 0 refills | 14 days | Status: CP
Start: 2020-04-05 — End: 2020-04-19

## 2020-04-21 ENCOUNTER — Ambulatory Visit: Admit: 2020-04-21 | Discharge: 2020-04-22 | Payer: MEDICARE

## 2020-04-21 DIAGNOSIS — N3000 Acute cystitis without hematuria: Principal | ICD-10-CM

## 2020-04-22 DIAGNOSIS — N3000 Acute cystitis without hematuria: Principal | ICD-10-CM

## 2020-04-22 MED ORDER — SULFAMETHOXAZOLE 400 MG-TRIMETHOPRIM 80 MG TABLET
ORAL_TABLET | Freq: Two times a day (BID) | ORAL | 0 refills | 10 days | Status: CP
Start: 2020-04-22 — End: 2020-05-02

## 2020-04-25 DIAGNOSIS — A488 Other specified bacterial diseases: Principal | ICD-10-CM

## 2020-04-26 DIAGNOSIS — N390 Urinary tract infection, site not specified: Principal | ICD-10-CM

## 2020-04-29 ENCOUNTER — Ambulatory Visit: Admit: 2020-04-29 | Discharge: 2020-04-30 | Payer: MEDICARE

## 2020-04-29 DIAGNOSIS — A498 Other bacterial infections of unspecified site: Principal | ICD-10-CM

## 2020-04-29 DIAGNOSIS — N390 Urinary tract infection, site not specified: Principal | ICD-10-CM

## 2020-04-29 DIAGNOSIS — N200 Calculus of kidney: Principal | ICD-10-CM

## 2020-04-29 DIAGNOSIS — R3915 Urgency of urination: Principal | ICD-10-CM

## 2020-04-29 DIAGNOSIS — R3989 Other symptoms and signs involving the genitourinary system: Principal | ICD-10-CM

## 2020-04-29 DIAGNOSIS — Z5181 Encounter for therapeutic drug level monitoring: Principal | ICD-10-CM

## 2020-04-29 MED ORDER — OMEPRAZOLE 40 MG CAPSULE,DELAYED RELEASE
ORAL_CAPSULE | Freq: Every day | ORAL | 3 refills | 90 days | Status: CP
Start: 2020-04-29 — End: ?

## 2020-05-02 DIAGNOSIS — N3000 Acute cystitis without hematuria: Principal | ICD-10-CM

## 2020-05-02 DIAGNOSIS — Z5181 Encounter for therapeutic drug level monitoring: Principal | ICD-10-CM

## 2020-05-02 MED ORDER — SULFAMETHOXAZOLE 400 MG-TRIMETHOPRIM 80 MG TABLET
ORAL_TABLET | Freq: Two times a day (BID) | ORAL | 0 refills | 18 days | Status: CP
Start: 2020-05-02 — End: 2020-05-20

## 2020-05-05 ENCOUNTER — Ambulatory Visit: Admit: 2020-05-05 | Discharge: 2020-05-06

## 2020-05-05 DIAGNOSIS — E7800 Pure hypercholesterolemia, unspecified: Principal | ICD-10-CM

## 2020-05-05 DIAGNOSIS — Z720 Tobacco use: Principal | ICD-10-CM

## 2020-05-05 DIAGNOSIS — I10 Essential (primary) hypertension: Principal | ICD-10-CM

## 2020-05-25 ENCOUNTER — Ambulatory Visit: Admit: 2020-05-25 | Payer: MEDICARE

## 2020-05-27 ENCOUNTER — Ambulatory Visit: Admit: 2020-05-27 | Discharge: 2020-05-28 | Payer: MEDICARE

## 2020-05-27 DIAGNOSIS — Z5181 Encounter for therapeutic drug level monitoring: Principal | ICD-10-CM

## 2020-05-29 DIAGNOSIS — N200 Calculus of kidney: Principal | ICD-10-CM

## 2020-05-29 DIAGNOSIS — A498 Other bacterial infections of unspecified site: Principal | ICD-10-CM

## 2020-05-29 DIAGNOSIS — L409 Psoriasis, unspecified: Principal | ICD-10-CM

## 2020-05-29 MED ORDER — COSENTYX PEN 300 MG/2 PENS (150 MG/ML) SUBCUTANEOUS
12 refills | 0 days
Start: 2020-05-29 — End: ?

## 2020-05-30 DIAGNOSIS — N390 Urinary tract infection, site not specified: Principal | ICD-10-CM

## 2020-05-30 DIAGNOSIS — A498 Other bacterial infections of unspecified site: Principal | ICD-10-CM

## 2020-05-30 DIAGNOSIS — N3000 Acute cystitis without hematuria: Principal | ICD-10-CM

## 2020-05-30 MED ORDER — SULFAMETHOXAZOLE 400 MG-TRIMETHOPRIM 80 MG TABLET
ORAL_TABLET | Freq: Two times a day (BID) | ORAL | 0 refills | 14 days | Status: CP
Start: 2020-05-30 — End: 2020-06-13

## 2020-05-31 ENCOUNTER — Ambulatory Visit: Admit: 2020-05-31 | Discharge: 2020-06-01 | Payer: MEDICARE

## 2020-05-31 DIAGNOSIS — A498 Other bacterial infections of unspecified site: Principal | ICD-10-CM

## 2020-05-31 DIAGNOSIS — D75A Glucose-6-phosphate dehydrogenase (G6PD) deficiency without anemia: Principal | ICD-10-CM

## 2020-05-31 DIAGNOSIS — R9431 Abnormal electrocardiogram [ECG] [EKG]: Principal | ICD-10-CM

## 2020-05-31 DIAGNOSIS — N390 Urinary tract infection, site not specified: Principal | ICD-10-CM

## 2020-05-31 MED ORDER — LEVOFLOXACIN 500 MG TABLET
ORAL_TABLET | Freq: Every day | ORAL | 0 refills | 42.00000 days | Status: CP
Start: 2020-05-31 — End: 2020-07-12

## 2020-05-31 MED ORDER — CIPROFLOXACIN 500 MG TABLET
ORAL_TABLET | Freq: Two times a day (BID) | ORAL | 0 refills | 42.00000 days | Status: CP
Start: 2020-05-31 — End: 2020-05-31

## 2020-05-31 MED ORDER — COSENTYX PEN 300 MG/2 PENS (150 MG/ML) SUBCUTANEOUS
12 refills | 0 days
Start: 2020-05-31 — End: ?

## 2020-06-03 ENCOUNTER — Ambulatory Visit: Admit: 2020-06-03 | Discharge: 2020-06-04 | Payer: MEDICARE

## 2020-06-09 ENCOUNTER — Ambulatory Visit: Admit: 2020-06-09 | Discharge: 2020-06-10

## 2020-06-09 DIAGNOSIS — E1165 Type 2 diabetes mellitus with hyperglycemia: Principal | ICD-10-CM

## 2020-06-09 MED ORDER — MONTELUKAST 10 MG TABLET
ORAL_TABLET | 3 refills | 0 days | Status: CP
Start: 2020-06-09 — End: ?

## 2020-06-09 MED ORDER — JARDIANCE 25 MG TABLET
ORAL_TABLET | 3 refills | 0 days | Status: CP
Start: 2020-06-09 — End: ?

## 2020-06-28 ENCOUNTER — Ambulatory Visit: Admit: 2020-06-28 | Discharge: 2020-06-29 | Payer: MEDICARE

## 2020-06-28 DIAGNOSIS — L409 Psoriasis, unspecified: Principal | ICD-10-CM

## 2020-06-28 MED ORDER — TRIAMCINOLONE ACETONIDE 0.1 % TOPICAL OINTMENT
INTRAMUSCULAR | 1 refills | 0.00000 days | Status: CP
Start: 2020-06-28 — End: ?

## 2020-06-28 MED ORDER — CLOBETASOL 0.05 % TOPICAL OINTMENT
OPHTHALMIC | 5 refills | 0.00000 days | Status: CP
Start: 2020-06-28 — End: ?

## 2020-06-28 MED ORDER — CLOBETASOL 0.05 % SCALP SOLUTION
OPHTHALMIC | 4 refills | 0.00000 days | Status: CP
Start: 2020-06-28 — End: ?

## 2020-08-01 ENCOUNTER — Ambulatory Visit: Admit: 2020-08-01 | Discharge: 2020-08-02 | Payer: MEDICARE

## 2020-08-01 DIAGNOSIS — N419 Inflammatory disease of prostate, unspecified: Principal | ICD-10-CM

## 2020-08-03 DIAGNOSIS — L409 Psoriasis, unspecified: Principal | ICD-10-CM

## 2020-08-03 DIAGNOSIS — Z79899 Other long term (current) drug therapy: Principal | ICD-10-CM

## 2020-08-03 MED ORDER — COSENTYX PEN 300 MG/2 PENS (150 MG/ML) SUBCUTANEOUS
SUBCUTANEOUS | 11 refills | 0.00000 days | Status: CP
Start: 2020-08-03 — End: ?

## 2020-08-15 DIAGNOSIS — I10 Essential (primary) hypertension: Principal | ICD-10-CM

## 2020-08-15 MED ORDER — LISINOPRIL 40 MG TABLET
ORAL_TABLET | Freq: Every day | ORAL | 3 refills | 90 days | Status: CP
Start: 2020-08-15 — End: 2021-08-15

## 2020-10-03 ENCOUNTER — Ambulatory Visit: Admit: 2020-10-03 | Payer: MEDICARE

## 2020-10-14 ENCOUNTER — Ambulatory Visit: Admit: 2020-10-14 | Discharge: 2020-10-15 | Payer: MEDICARE

## 2020-10-14 DIAGNOSIS — I10 Essential (primary) hypertension: Principal | ICD-10-CM

## 2020-10-14 DIAGNOSIS — Z720 Tobacco use: Principal | ICD-10-CM

## 2020-10-14 DIAGNOSIS — Z1211 Encounter for screening for malignant neoplasm of colon: Principal | ICD-10-CM

## 2020-10-14 DIAGNOSIS — E7800 Pure hypercholesterolemia, unspecified: Principal | ICD-10-CM

## 2020-10-14 DIAGNOSIS — E1165 Type 2 diabetes mellitus with hyperglycemia: Principal | ICD-10-CM

## 2020-10-14 DIAGNOSIS — F331 Major depressive disorder, recurrent, moderate: Principal | ICD-10-CM

## 2020-10-14 MED ORDER — ATORVASTATIN 40 MG TABLET
ORAL_TABLET | Freq: Every day | ORAL | 3 refills | 90 days | Status: CP
Start: 2020-10-14 — End: ?

## 2020-10-14 MED ORDER — METFORMIN ER 500 MG TABLET,EXTENDED RELEASE 24 HR
ORAL_TABLET | Freq: Two times a day (BID) | ORAL | 3 refills | 90.00000 days | Status: CP
Start: 2020-10-14 — End: 2021-10-09

## 2020-10-25 ENCOUNTER — Ambulatory Visit: Admit: 2020-10-25 | Discharge: 2020-10-26 | Payer: MEDICARE | Attending: Urology | Primary: Urology

## 2020-10-25 DIAGNOSIS — N200 Calculus of kidney: Principal | ICD-10-CM

## 2020-10-25 DIAGNOSIS — A498 Other bacterial infections of unspecified site: Principal | ICD-10-CM

## 2020-12-22 MED ORDER — METHOCARBAMOL 750 MG TABLET
ORAL_TABLET | 3 refills | 0 days
Start: 2020-12-22 — End: ?

## 2020-12-23 MED ORDER — METHOCARBAMOL 750 MG TABLET
ORAL_TABLET | 3 refills | 0 days | Status: CP
Start: 2020-12-23 — End: ?

## 2021-02-01 MED ORDER — OMEPRAZOLE 40 MG CAPSULE,DELAYED RELEASE
ORAL_CAPSULE | Freq: Every day | ORAL | 3 refills | 90 days | Status: CP
Start: 2021-02-01 — End: ?

## 2021-02-21 ENCOUNTER — Ambulatory Visit: Admit: 2021-02-21 | Discharge: 2021-02-21 | Payer: MEDICARE

## 2021-02-21 DIAGNOSIS — Z79899 Other long term (current) drug therapy: Principal | ICD-10-CM

## 2021-02-21 DIAGNOSIS — L219 Seborrheic dermatitis, unspecified: Principal | ICD-10-CM

## 2021-02-21 DIAGNOSIS — L409 Psoriasis, unspecified: Principal | ICD-10-CM

## 2021-02-21 MED ORDER — COSENTYX PEN 300 MG/2 PENS (150 MG/ML) SUBCUTANEOUS
SUBCUTANEOUS | 11 refills | 0.00000 days | Status: CP
Start: 2021-02-21 — End: ?

## 2021-02-21 MED ORDER — KETOCONAZOLE 2 % TOPICAL CREAM
TOPICAL | 5 refills | 0.00000 days | Status: CP
Start: 2021-02-21 — End: ?

## 2021-02-21 MED ORDER — MUPIROCIN 2 % TOPICAL OINTMENT
TOPICAL | 0 refills | 0.00000 days | Status: CP
Start: 2021-02-21 — End: ?

## 2021-03-07 DIAGNOSIS — I10 Essential (primary) hypertension: Principal | ICD-10-CM

## 2021-03-07 MED ORDER — AMLODIPINE 5 MG TABLET
ORAL_TABLET | Freq: Every day | ORAL | 3 refills | 90 days | Status: CP
Start: 2021-03-07 — End: ?

## 2021-04-07 ENCOUNTER — Ambulatory Visit: Admit: 2021-04-07 | Discharge: 2021-04-08 | Payer: MEDICARE

## 2021-04-07 MED ORDER — MONTELUKAST 10 MG TABLET
ORAL_TABLET | Freq: Every evening | ORAL | 3 refills | 90 days | Status: CP
Start: 2021-04-07 — End: ?

## 2021-04-07 MED ORDER — METFORMIN ER 500 MG TABLET,EXTENDED RELEASE 24 HR
ORAL_TABLET | Freq: Every day | ORAL | 3 refills | 90 days | Status: CP
Start: 2021-04-07 — End: 2022-04-02

## 2021-04-07 MED ORDER — LORATADINE 10 MG TABLET
ORAL_TABLET | Freq: Every day | ORAL | 3 refills | 90 days | Status: CP
Start: 2021-04-07 — End: ?

## 2021-04-07 MED ORDER — EMPAGLIFLOZIN 25 MG TABLET
ORAL_TABLET | Freq: Every day | ORAL | 3 refills | 90 days | Status: CP
Start: 2021-04-07 — End: ?

## 2021-04-07 MED ORDER — LISINOPRIL 40 MG TABLET
ORAL_TABLET | Freq: Every day | ORAL | 3 refills | 90 days | Status: CP
Start: 2021-04-07 — End: 2022-04-07

## 2021-04-14 ENCOUNTER — Ambulatory Visit: Admit: 2021-04-14 | Discharge: 2021-04-15 | Payer: MEDICARE

## 2021-06-23 MED ORDER — FLUTICASONE PROPIONATE 50 MCG/ACTUATION NASAL SPRAY,SUSPENSION
Freq: Every day | NASAL | 4 refills | 180 days | Status: CP
Start: 2021-06-23 — End: ?

## 2021-07-14 DIAGNOSIS — I10 Essential (primary) hypertension: Principal | ICD-10-CM

## 2021-07-14 MED ORDER — METHOCARBAMOL 750 MG TABLET
ORAL_TABLET | Freq: Three times a day (TID) | ORAL | 3 refills | 90 days | Status: CP | PRN
Start: 2021-07-14 — End: ?

## 2021-07-14 MED ORDER — ATORVASTATIN 40 MG TABLET
ORAL_TABLET | Freq: Every day | ORAL | 3 refills | 90 days | Status: CP
Start: 2021-07-14 — End: ?

## 2021-07-14 MED ORDER — LISINOPRIL 40 MG TABLET
ORAL_TABLET | Freq: Every day | ORAL | 3 refills | 90 days | Status: CP
Start: 2021-07-14 — End: 2022-07-14

## 2021-07-14 MED ORDER — AMLODIPINE 5 MG TABLET
ORAL_TABLET | Freq: Every day | ORAL | 3 refills | 90 days | Status: CP
Start: 2021-07-14 — End: ?

## 2021-08-22 ENCOUNTER — Ambulatory Visit: Admit: 2021-08-22 | Payer: MEDICARE

## 2021-09-28 ENCOUNTER — Ambulatory Visit: Admit: 2021-09-28 | Discharge: 2021-09-29 | Payer: MEDICARE

## 2021-09-28 DIAGNOSIS — R202 Paresthesia of skin: Principal | ICD-10-CM

## 2021-09-28 DIAGNOSIS — R208 Other disturbances of skin sensation: Principal | ICD-10-CM

## 2021-09-28 MED ORDER — VALACYCLOVIR 1 GRAM TABLET
ORAL_TABLET | Freq: Three times a day (TID) | ORAL | 0 refills | 7 days | Status: CP
Start: 2021-09-28 — End: 2021-10-05

## 2021-09-28 MED ORDER — GABAPENTIN 100 MG CAPSULE
ORAL_CAPSULE | Freq: Three times a day (TID) | ORAL | 1 refills | 30 days | Status: CP
Start: 2021-09-28 — End: 2022-09-28

## 2021-10-24 ENCOUNTER — Ambulatory Visit: Admit: 2021-10-24 | Discharge: 2021-10-25 | Payer: MEDICARE

## 2021-10-24 MED ORDER — GABAPENTIN 300 MG CAPSULE
ORAL_CAPSULE | Freq: Three times a day (TID) | ORAL | 1 refills | 90 days | Status: CP
Start: 2021-10-24 — End: 2022-10-24

## 2022-01-31 MED ORDER — OMEPRAZOLE 40 MG CAPSULE,DELAYED RELEASE
ORAL_CAPSULE | Freq: Every day | ORAL | 3 refills | 0 days
Start: 2022-01-31 — End: ?

## 2022-02-01 MED ORDER — OMEPRAZOLE 40 MG CAPSULE,DELAYED RELEASE
ORAL_CAPSULE | Freq: Every day | ORAL | 3 refills | 90 days | Status: CP
Start: 2022-02-01 — End: ?

## 2022-02-26 DIAGNOSIS — L409 Psoriasis, unspecified: Principal | ICD-10-CM

## 2022-02-26 MED ORDER — COSENTYX PEN 300 MG/2 PENS (150 MG/ML) SUBCUTANEOUS
12 refills | 0.00000 days
Start: 2022-02-26 — End: ?

## 2022-02-27 DIAGNOSIS — L409 Psoriasis, unspecified: Principal | ICD-10-CM

## 2022-02-27 MED ORDER — COSENTYX PEN 300 MG/2 PENS (150 MG/ML) SUBCUTANEOUS
11 refills | 0.00000 days
Start: 2022-02-27 — End: ?

## 2022-03-01 MED ORDER — COSENTYX PEN 300 MG/2 PENS (150 MG/ML) SUBCUTANEOUS
SUBCUTANEOUS | 3 refills | 0.00000 days | Status: CP
Start: 2022-03-01 — End: ?

## 2022-03-16 DIAGNOSIS — I10 Essential (primary) hypertension: Principal | ICD-10-CM

## 2022-03-16 MED ORDER — AMLODIPINE 5 MG TABLET
ORAL_TABLET | Freq: Every day | ORAL | 3 refills | 90 days | Status: CP
Start: 2022-03-16 — End: ?

## 2022-04-03 ENCOUNTER — Ambulatory Visit: Admit: 2022-04-03 | Discharge: 2022-04-03 | Payer: MEDICARE

## 2022-04-03 DIAGNOSIS — Z79899 Other long term (current) drug therapy: Principal | ICD-10-CM

## 2022-04-03 DIAGNOSIS — L409 Psoriasis, unspecified: Principal | ICD-10-CM

## 2022-04-03 DIAGNOSIS — L729 Follicular cyst of the skin and subcutaneous tissue, unspecified: Principal | ICD-10-CM

## 2022-04-03 DIAGNOSIS — L821 Other seborrheic keratosis: Principal | ICD-10-CM

## 2022-04-03 MED ORDER — COSENTYX PEN 300 MG/2 PENS (150 MG/ML) SUBCUTANEOUS
SUBCUTANEOUS | 11 refills | 0.00000 days | Status: CP
Start: 2022-04-03 — End: ?

## 2022-04-20 MED ORDER — LORATADINE 10 MG TABLET
ORAL_TABLET | Freq: Every day | ORAL | 3 refills | 90 days | Status: CP
Start: 2022-04-20 — End: ?

## 2022-04-20 MED ORDER — METFORMIN ER 500 MG TABLET,EXTENDED RELEASE 24 HR
ORAL_TABLET | 1 refills | 0 days | Status: CP
Start: 2022-04-20 — End: ?

## 2022-05-02 ENCOUNTER — Ambulatory Visit: Admit: 2022-05-02 | Discharge: 2022-05-03 | Payer: MEDICARE

## 2022-05-08 MED ORDER — MONTELUKAST 10 MG TABLET
ORAL_TABLET | Freq: Every evening | ORAL | 3 refills | 90 days | Status: CP
Start: 2022-05-08 — End: ?

## 2022-05-25 ENCOUNTER — Ambulatory Visit: Admit: 2022-05-25 | Discharge: 2022-05-26 | Payer: MEDICARE

## 2022-05-25 MED ORDER — DOXYCYCLINE HYCLATE 100 MG TABLET
ORAL_TABLET | Freq: Two times a day (BID) | ORAL | 0 refills | 10 days | Status: CP
Start: 2022-05-25 — End: 2022-06-04

## 2022-09-19 MED ORDER — FLUTICASONE PROPIONATE 50 MCG/ACTUATION NASAL SPRAY,SUSPENSION
NASAL | 3 refills | 0 days | Status: CP
Start: 2022-09-19 — End: ?

## 2022-10-18 DIAGNOSIS — I10 Essential (primary) hypertension: Principal | ICD-10-CM

## 2022-10-18 MED ORDER — ATORVASTATIN 40 MG TABLET
ORAL_TABLET | ORAL | 3 refills | 90 days | Status: CP
Start: 2022-10-18 — End: ?

## 2022-10-18 MED ORDER — LISINOPRIL 40 MG TABLET
ORAL_TABLET | Freq: Every day | ORAL | 3 refills | 90 days | Status: CP
Start: 2022-10-18 — End: ?

## 2022-10-18 MED ORDER — METHOCARBAMOL 750 MG TABLET
ORAL_TABLET | 3 refills | 0 days | Status: CP
Start: 2022-10-18 — End: ?

## 2023-03-25 DIAGNOSIS — L409 Psoriasis, unspecified: Principal | ICD-10-CM

## 2023-03-25 MED ORDER — COSENTYX PEN 300 MG/2 PENS (150 MG/ML) SUBCUTANEOUS
11 refills | 0.00 days
Start: 2023-03-25 — End: ?

## 2023-03-26 MED ORDER — COSENTYX PEN 300 MG/2 PENS (150 MG/ML) SUBCUTANEOUS
11 refills | 0.00 days | Status: CP
Start: 2023-03-26 — End: ?

## 2023-04-14 DIAGNOSIS — I10 Essential (primary) hypertension: Principal | ICD-10-CM

## 2023-04-14 MED ORDER — AMLODIPINE 5 MG TABLET
ORAL_TABLET | Freq: Every day | ORAL | 3 refills | 0 days
Start: 2023-04-14 — End: ?

## 2023-04-16 MED ORDER — AMLODIPINE 5 MG TABLET
ORAL_TABLET | Freq: Every day | ORAL | 3 refills | 90 days | Status: CP
Start: 2023-04-16 — End: ?

## 2023-05-14 ENCOUNTER — Ambulatory Visit: Admit: 2023-05-14 | Discharge: 2023-05-15 | Payer: MEDICARE

## 2023-05-14 DIAGNOSIS — L409 Psoriasis, unspecified: Principal | ICD-10-CM

## 2023-05-14 DIAGNOSIS — I788 Other diseases of capillaries: Principal | ICD-10-CM

## 2023-05-14 DIAGNOSIS — D1801 Hemangioma of skin and subcutaneous tissue: Principal | ICD-10-CM

## 2023-05-14 DIAGNOSIS — D229 Melanocytic nevi, unspecified: Principal | ICD-10-CM

## 2023-05-14 DIAGNOSIS — Z79899 Other long term (current) drug therapy: Principal | ICD-10-CM

## 2023-05-14 DIAGNOSIS — L821 Other seborrheic keratosis: Principal | ICD-10-CM

## 2023-05-14 DIAGNOSIS — L814 Other melanin hyperpigmentation: Principal | ICD-10-CM

## 2023-05-14 MED ORDER — COSENTYX PEN 300 MG/2 PENS (150 MG/ML) SUBCUTANEOUS PEN INJECTOR
SUBCUTANEOUS | 11 refills | 0.00000 days | Status: CP
Start: 2023-05-14 — End: ?

## 2023-05-14 MED ORDER — CLOBETASOL 0.05 % SCALP SOLUTION
TOPICAL | 4 refills | 0.00000 days | Status: CP
Start: 2023-05-14 — End: ?

## 2023-05-14 MED ORDER — CLOBETASOL 0.05 % TOPICAL OINTMENT
TOPICAL | 5 refills | 0.00000 days | Status: CP
Start: 2023-05-14 — End: ?

## 2023-05-21 ENCOUNTER — Ambulatory Visit: Admit: 2023-05-21 | Discharge: 2023-05-22 | Payer: MEDICARE

## 2023-05-21 DIAGNOSIS — G4733 Obstructive sleep apnea (adult) (pediatric): Principal | ICD-10-CM

## 2023-05-21 DIAGNOSIS — E119 Type 2 diabetes mellitus without complications: Principal | ICD-10-CM

## 2023-05-21 DIAGNOSIS — N3000 Acute cystitis without hematuria: Principal | ICD-10-CM

## 2023-05-21 DIAGNOSIS — R0789 Other chest pain: Principal | ICD-10-CM

## 2023-05-21 DIAGNOSIS — E7800 Pure hypercholesterolemia, unspecified: Principal | ICD-10-CM

## 2023-05-21 DIAGNOSIS — Z79899 Other long term (current) drug therapy: Principal | ICD-10-CM

## 2023-05-21 DIAGNOSIS — Z720 Tobacco use: Principal | ICD-10-CM

## 2023-05-21 DIAGNOSIS — I10 Essential (primary) hypertension: Principal | ICD-10-CM

## 2023-05-21 DIAGNOSIS — Z87891 Personal history of nicotine dependence: Principal | ICD-10-CM

## 2023-05-21 DIAGNOSIS — R300 Dysuria: Principal | ICD-10-CM

## 2023-05-21 DIAGNOSIS — F331 Major depressive disorder, recurrent, moderate: Principal | ICD-10-CM

## 2023-05-21 MED ORDER — CIPROFLOXACIN 500 MG TABLET
ORAL_TABLET | Freq: Two times a day (BID) | ORAL | 0 refills | 10.00000 days | Status: CP
Start: 2023-05-21 — End: 2023-05-31

## 2023-05-21 MED ORDER — ATORVASTATIN 40 MG TABLET
ORAL_TABLET | Freq: Every day | ORAL | 3 refills | 90.00000 days | Status: CP
Start: 2023-05-21 — End: ?

## 2023-05-21 MED ORDER — METFORMIN ER 500 MG TABLET,EXTENDED RELEASE 24 HR
ORAL_TABLET | Freq: Two times a day (BID) | ORAL | 3 refills | 90.00000 days | Status: CP
Start: 2023-05-21 — End: ?

## 2023-05-22 MED ORDER — GLIPIZIDE 5 MG TABLET
ORAL_TABLET | Freq: Two times a day (BID) | ORAL | 3 refills | 90.00000 days | Status: CP
Start: 2023-05-22 — End: 2024-06-25

## 2023-05-23 DIAGNOSIS — R39198 Other difficulties with micturition: Principal | ICD-10-CM

## 2023-05-23 DIAGNOSIS — Z960 Presence of urogenital implants: Principal | ICD-10-CM

## 2023-05-23 DIAGNOSIS — N3000 Acute cystitis without hematuria: Principal | ICD-10-CM

## 2023-05-31 MED ORDER — MONTELUKAST 10 MG TABLET
ORAL_TABLET | Freq: Every evening | ORAL | 3 refills | 0.00000 days
Start: 2023-05-31 — End: ?

## 2023-05-31 MED ORDER — LORATADINE 10 MG TABLET
ORAL_TABLET | Freq: Every day | ORAL | 3 refills | 0.00000 days
Start: 2023-05-31 — End: ?

## 2023-06-03 MED ORDER — LORATADINE 10 MG TABLET
ORAL_TABLET | Freq: Every day | ORAL | 3 refills | 90.00000 days | Status: CP
Start: 2023-06-03 — End: ?

## 2023-06-04 MED ORDER — MONTELUKAST 10 MG TABLET
ORAL_TABLET | Freq: Every evening | ORAL | 3 refills | 90.00000 days | Status: CP
Start: 2023-06-04 — End: ?

## 2023-06-07 ENCOUNTER — Ambulatory Visit: Admit: 2023-06-07 | Discharge: 2023-06-08 | Payer: MEDICARE

## 2023-06-07 ENCOUNTER — Ambulatory Visit
Admit: 2023-06-07 | Discharge: 2023-06-08 | Payer: MEDICARE | Attending: Student in an Organized Health Care Education/Training Program | Primary: Student in an Organized Health Care Education/Training Program

## 2023-06-07 DIAGNOSIS — N3000 Acute cystitis without hematuria: Principal | ICD-10-CM

## 2023-06-07 DIAGNOSIS — R339 Retention of urine, unspecified: Principal | ICD-10-CM

## 2023-06-07 DIAGNOSIS — E1165 Type 2 diabetes mellitus with hyperglycemia: Principal | ICD-10-CM

## 2023-06-07 MED ORDER — TAMSULOSIN 0.4 MG CAPSULE
ORAL_CAPSULE | Freq: Every evening | ORAL | 3 refills | 90.00000 days | Status: CP
Start: 2023-06-07 — End: ?

## 2023-06-18 MED ORDER — OMEPRAZOLE 40 MG CAPSULE,DELAYED RELEASE
ORAL_CAPSULE | Freq: Every day | ORAL | 3 refills | 0.00000 days
Start: 2023-06-18 — End: ?

## 2023-06-19 MED ORDER — OMEPRAZOLE 40 MG CAPSULE,DELAYED RELEASE
ORAL_CAPSULE | Freq: Every day | ORAL | 3 refills | 90.00000 days
Start: 2023-06-19 — End: ?

## 2023-06-24 MED ORDER — OMEPRAZOLE 40 MG CAPSULE,DELAYED RELEASE
ORAL_CAPSULE | Freq: Every day | ORAL | 3 refills | 90.00000 days
Start: 2023-06-24 — End: ?

## 2023-07-03 ENCOUNTER — Inpatient Hospital Stay: Admit: 2023-07-03 | Discharge: 2023-07-04 | Payer: MEDICARE

## 2023-07-03 DIAGNOSIS — R0789 Other chest pain: Principal | ICD-10-CM

## 2023-07-09 MED ORDER — GABAPENTIN 300 MG CAPSULE
ORAL_CAPSULE | Freq: Three times a day (TID) | ORAL | 1 refills | 90.00000 days | Status: CP
Start: 2023-07-09 — End: 2024-07-08

## 2023-10-11 ENCOUNTER — Encounter: Admit: 2023-10-11 | Discharge: 2023-10-11 | Payer: MEDICARE

## 2023-10-11 DIAGNOSIS — E7800 Pure hypercholesterolemia, unspecified: Principal | ICD-10-CM

## 2023-10-11 DIAGNOSIS — Z720 Tobacco use: Principal | ICD-10-CM

## 2023-10-11 DIAGNOSIS — Z87891 Personal history of nicotine dependence: Principal | ICD-10-CM

## 2023-10-11 DIAGNOSIS — Z125 Encounter for screening for malignant neoplasm of prostate: Principal | ICD-10-CM

## 2023-10-11 DIAGNOSIS — Z1211 Encounter for screening for malignant neoplasm of colon: Principal | ICD-10-CM

## 2023-10-11 DIAGNOSIS — E1165 Type 2 diabetes mellitus with hyperglycemia: Principal | ICD-10-CM

## 2023-10-11 DIAGNOSIS — I10 Essential (primary) hypertension: Principal | ICD-10-CM

## 2023-10-11 MED ORDER — LISINOPRIL 40 MG TABLET
ORAL_TABLET | Freq: Every day | ORAL | 3 refills | 90.00000 days | Status: CP
Start: 2023-10-11 — End: ?

## 2023-10-14 MED ORDER — LANCETS
3 refills | 0.00000 days | Status: CP
Start: 2023-10-14 — End: ?

## 2023-10-14 MED ORDER — BLOOD-GLUCOSE METER KIT WRAPPER
ORAL | 0 refills | 0.00000 days | Status: CP
Start: 2023-10-14 — End: ?

## 2023-10-14 MED ORDER — BLOOD GLUCOSE TEST STRIPS
ORAL_STRIP | Freq: Two times a day (BID) | 3 refills | 0.00000 days | Status: CP
Start: 2023-10-14 — End: 2024-01-12

## 2023-11-09 DIAGNOSIS — R195 Other fecal abnormalities: Principal | ICD-10-CM

## 2023-12-04 MED ORDER — METHOCARBAMOL 750 MG TABLET
ORAL_TABLET | Freq: Three times a day (TID) | ORAL | 3 refills | 0.00000 days | PRN
Start: 2023-12-04 — End: ?

## 2023-12-05 MED ORDER — METHOCARBAMOL 750 MG TABLET
ORAL_TABLET | Freq: Three times a day (TID) | ORAL | 1 refills | 90.00000 days | Status: CP | PRN
Start: 2023-12-05 — End: ?

## 2024-01-23 DIAGNOSIS — I10 Essential (primary) hypertension: Principal | ICD-10-CM

## 2024-01-23 DIAGNOSIS — Z720 Tobacco use: Principal | ICD-10-CM

## 2024-01-23 DIAGNOSIS — E7800 Pure hypercholesterolemia, unspecified: Principal | ICD-10-CM

## 2024-01-23 DIAGNOSIS — F331 Major depressive disorder, recurrent, moderate: Principal | ICD-10-CM

## 2024-01-23 DIAGNOSIS — R195 Other fecal abnormalities: Principal | ICD-10-CM

## 2024-01-23 DIAGNOSIS — E1165 Type 2 diabetes mellitus with hyperglycemia: Principal | ICD-10-CM

## 2024-01-23 MED ORDER — BLOOD-GLUCOSE METER KIT WRAPPER
ORAL | 0 refills | 0.00000 days | Status: CP
Start: 2024-01-23 — End: ?

## 2024-01-23 MED ORDER — BLOOD GLUCOSE TEST STRIPS
ORAL_STRIP | Freq: Two times a day (BID) | 3 refills | 0.00000 days | Status: CP
Start: 2024-01-23 — End: 2024-04-22

## 2024-01-23 MED ORDER — LANCETS
3 refills | 0.00000 days | Status: CP
Start: 2024-01-23 — End: ?
